# Patient Record
Sex: Female | Born: 1935 | Race: White | Hispanic: No | Smoking: Never smoker
Health system: Southern US, Community
[De-identification: ages and names within clinical notes are randomized; demographics above are authoritative.]

## PROBLEM LIST (undated history)

## (undated) DIAGNOSIS — N200 Calculus of kidney: Secondary | ICD-10-CM

## (undated) DIAGNOSIS — I5181 Takotsubo syndrome: Secondary | ICD-10-CM

## (undated) DIAGNOSIS — N289 Disorder of kidney and ureter, unspecified: Secondary | ICD-10-CM

## (undated) DIAGNOSIS — J329 Chronic sinusitis, unspecified: Secondary | ICD-10-CM

## (undated) DIAGNOSIS — M1990 Unspecified osteoarthritis, unspecified site: Secondary | ICD-10-CM

## (undated) DIAGNOSIS — K222 Esophageal obstruction: Secondary | ICD-10-CM

## (undated) DIAGNOSIS — I499 Cardiac arrhythmia, unspecified: Secondary | ICD-10-CM

## (undated) DIAGNOSIS — M797 Fibromyalgia: Secondary | ICD-10-CM

## (undated) DIAGNOSIS — I10 Essential (primary) hypertension: Secondary | ICD-10-CM

## (undated) HISTORY — DX: Cardiac arrhythmia, unspecified: I49.9

## (undated) HISTORY — PX: ABDOMINAL HYSTERECTOMY: SHX81

## (undated) HISTORY — DX: Takotsubo syndrome: I51.81

## (undated) HISTORY — PX: EYE SURGERY: SHX253

## (undated) HISTORY — DX: Unspecified osteoarthritis, unspecified site: M19.90

## (undated) HISTORY — DX: Essential (primary) hypertension: I10

## (undated) HISTORY — DX: Esophageal obstruction: K22.2

## (undated) HISTORY — PX: CHOLECYSTECTOMY: SHX55

## (undated) HISTORY — DX: Disorder of kidney and ureter, unspecified: N28.9

---

## 2006-04-29 ENCOUNTER — Emergency Department (HOSPITAL_COMMUNITY): Admission: EM | Admit: 2006-04-29 | Discharge: 2006-04-29 | Payer: Self-pay | Admitting: Emergency Medicine

## 2007-01-15 DIAGNOSIS — I5181 Takotsubo syndrome: Secondary | ICD-10-CM

## 2007-01-15 HISTORY — DX: Takotsubo syndrome: I51.81

## 2008-04-18 ENCOUNTER — Encounter: Payer: Self-pay | Admitting: Cardiology

## 2008-06-26 ENCOUNTER — Inpatient Hospital Stay (HOSPITAL_COMMUNITY): Admission: EM | Admit: 2008-06-26 | Discharge: 2008-06-29 | Payer: Self-pay | Admitting: Cardiology

## 2008-06-26 ENCOUNTER — Ambulatory Visit: Payer: Self-pay | Admitting: Cardiology

## 2008-06-27 ENCOUNTER — Encounter: Payer: Self-pay | Admitting: Cardiology

## 2008-07-01 ENCOUNTER — Encounter: Payer: Self-pay | Admitting: Cardiology

## 2008-07-06 ENCOUNTER — Encounter: Payer: Self-pay | Admitting: Internal Medicine

## 2008-07-06 ENCOUNTER — Encounter: Payer: Self-pay | Admitting: Cardiology

## 2008-07-13 ENCOUNTER — Ambulatory Visit: Payer: Self-pay | Admitting: Cardiology

## 2008-10-25 ENCOUNTER — Ambulatory Visit: Payer: Self-pay | Admitting: Cardiology

## 2008-10-25 DIAGNOSIS — R0602 Shortness of breath: Secondary | ICD-10-CM

## 2008-10-25 DIAGNOSIS — E7800 Pure hypercholesterolemia, unspecified: Secondary | ICD-10-CM

## 2008-10-25 DIAGNOSIS — R5383 Other fatigue: Secondary | ICD-10-CM

## 2008-10-25 DIAGNOSIS — M069 Rheumatoid arthritis, unspecified: Secondary | ICD-10-CM | POA: Insufficient documentation

## 2008-10-25 DIAGNOSIS — I428 Other cardiomyopathies: Secondary | ICD-10-CM

## 2008-10-25 DIAGNOSIS — K449 Diaphragmatic hernia without obstruction or gangrene: Secondary | ICD-10-CM | POA: Insufficient documentation

## 2008-10-25 DIAGNOSIS — I200 Unstable angina: Secondary | ICD-10-CM

## 2008-10-25 DIAGNOSIS — R05 Cough: Secondary | ICD-10-CM

## 2008-10-25 DIAGNOSIS — I10 Essential (primary) hypertension: Secondary | ICD-10-CM | POA: Insufficient documentation

## 2008-10-25 DIAGNOSIS — R5381 Other malaise: Secondary | ICD-10-CM

## 2008-12-02 ENCOUNTER — Encounter: Payer: Self-pay | Admitting: Cardiology

## 2008-12-04 ENCOUNTER — Encounter: Payer: Self-pay | Admitting: Cardiology

## 2008-12-05 ENCOUNTER — Encounter: Payer: Self-pay | Admitting: Cardiology

## 2009-04-18 ENCOUNTER — Ambulatory Visit: Payer: Self-pay | Admitting: Cardiology

## 2010-02-13 NOTE — Letter (Signed)
Summary: [facility] D/C DR. Beatrix Fetters [facility]  [facility] D/C DR. Kirstie Peri   Imported By: Zachary George 04/17/2009 16:28:22  _____________________________________________________________________  External Attachment:    Type:   Image     Comment:   External Document

## 2010-02-13 NOTE — Assessment & Plan Note (Signed)
Summary: 6 MONTH FU RECV LETTER VS   Visit Type:  Follow-up Primary Provider:  Dr. Kirstie Peri  CC:  Cardiomyopathy..  History of Present Illness: The patient returns for followup of her previously reduced ejection fraction with nonobstructive coronary disease (minimal). Since I last saw her she was in the hospital with pain which was felt to be atypical. She has had an outpatient echocardiogram and I reviewed this. Interestingly her ejection fraction was said to be normal though there was some evidence of diastolic dysfunction.  Today she is feeling well and reports no acute symptoms. She does her chores of daily living including vacuuming. She will get fatigued but she paces herself and does well. She denies chest discomfort, neck or arm discomfort. She has no significant shortness of breath and denies any PND or orthopnea. She does not notice any presyncope or syncope. She has trace ankle edema.  Preventive Screening-Counseling & Management  Alcohol-Tobacco     Smoking Status: never  Current Medications (verified): 1)  Lisinopril 5 Mg Tabs (Lisinopril) .... Take 1 Tablet By Mouth Once A Day 2)  Loratadine 10 Mg Tabs (Loratadine) .... Take 1 Tablet By Mouth Once A Day 3)  Prednisone 5 Mg Tabs (Prednisone) .... Take 1 Tablet By Mouth Once A Day 4)  Simvastatin 40 Mg Tabs (Simvastatin) .... Take 1 Tablet By Mouth Once A Day 5)  Metoprolol Succinate 25 Mg Xr24h-Tab (Metoprolol Succinate) .... Take 1 Tablet By Mouth Once A Day 6)  Eye-Vites  Tabs (Multiple Vitamins-Minerals) .... Take 1 Tablet By Mouth Once A Day 7)  Calcium 1200-1000 Mg-Unit Chew (Calcium Carbonate-Vit D-Min) .... Take 1 Tablet By Mouth Once A Day (Doesn't Take Everyday) 8)  Imodium A-D 2 Mg Tabs (Loperamide Hcl) .... As Needed  Allergies: 1)  ! Pcn  Comments:  Nurse/Medical Assistant: The patient's medications were reviewed with the patient and were updated in the Medication List. Pt brought medication bottles to  office visit.  Cyril Loosen, RN, BSN (April 18, 2009 10:42 AM)  Past History:  Past Medical History: Cardiomyopathy(35% nonischemic.  Most recent EF NL echo 11/10) , mild coronary plaque, hypertension, fibromyalgia, chronic steroid use (apparently secondary to fibromyalgia), nephrolithiasis, cholelithiasis, osteoporosis, degenerative joint disease  Past Surgical History: Reviewed history from 10/25/2008 and no changes required. Abdominal Hysterectomy-Total Cholecystectomy  Review of Systems       As stated in the HPI and negative for all other systems.   Vital Signs:  Patient profile:   77 year old female Height:      64 inches Weight:      118.50 pounds Pulse rate:   59 / minute BP sitting:   143 / 83  (left arm) Cuff size:   regular  Vitals Entered By: Cyril Loosen, RN, BSN (April 18, 2009 10:37 AM) CC: Cardiomyopathy. Comments Follow up visit   Physical Exam  General:  Well developed, well nourished, in no acute distress. Head:  normocephalic and atraumatic Neck:  Neck supple, no JVD. No masses, thyromegaly or abnormal cervical nodes. Chest Wall:  no deformities or breast masses noted Lungs:  Clear bilaterally to auscultation and percussion. Abdomen:  Bowel sounds positive; abdomen soft and non-tender without masses, organomegaly, or hernias noted. No hepatosplenomegaly. Msk:  Back normal, normal gait. Muscle strength and tone normal. Extremities:  No clubbing or cyanosis. Neurologic:  Alert and oriented x 3. Psych:  Normal affect.   Detailed Cardiovascular Exam  Neck    Carotids: Carotids full and equal  bilaterally without bruits.      Neck Veins: Normal, no JVD.    Heart    Inspection: no deformities or lifts noted.      Palpation: normal PMI with no thrills palpable.      Auscultation: regular rate and rhythm, S1, S2 without murmurs, rubs, gallops, or clicks.    Vascular    Abdominal Aorta: no palpable masses, pulsations, or audible bruits.       Femoral Pulses: right femoral bruit    Pedal Pulses: normal pedal pulses bilaterally.      Radial Pulses: normal radial pulses bilaterally.      Peripheral Circulation: no clubbing, cyanosis, or edema noted with normal capillary refill.     Impression & Recommendations:  Problem # 1:  CARDIOMYOPATHY (ICD-425.4) Her EF is now shown to be normal on echo. She has no symptoms. At this point she can continue with current medications without further testing.  Problem # 2:  SHORTNESS OF BREATH (ICD-786.05) She seems to be doing well with this. She tolerates some mild dyspnea with exertion. No further evaluation is warranted. Of note there was some suggestion of diastolic dysfunction on the recent echo. Therefore, salt and fluid restriction  as well as control of her blood pressure should be part of the therapy.  Problem # 3:  HYPERTENSION (ICD-401.9) Her blood pressure is very slightly elevated but she reports that it is otherwise well controlled.  Patient Instructions: 1)  Your physician recommends that you continue on your current medications as directed. Please refer to the Current Medication list given to you today.  2)  No follow up needed.

## 2010-02-16 ENCOUNTER — Telehealth (INDEPENDENT_AMBULATORY_CARE_PROVIDER_SITE_OTHER): Payer: Self-pay | Admitting: *Deleted

## 2010-02-19 DIAGNOSIS — I059 Rheumatic mitral valve disease, unspecified: Secondary | ICD-10-CM
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
## 2010-03-01 ENCOUNTER — Inpatient Hospital Stay (HOSPITAL_COMMUNITY)
Admission: EM | Admit: 2010-03-01 | Discharge: 2010-03-14 | DRG: 312 | Disposition: A | Discharge status: Skilled Nursing Facility | Payer: No Typology Code available for payment source | Source: Ambulatory Visit | Attending: Internal Medicine | Admitting: Internal Medicine

## 2010-03-01 ENCOUNTER — Emergency Department (HOSPITAL_COMMUNITY): Payer: No Typology Code available for payment source

## 2010-03-01 ENCOUNTER — Encounter (HOSPITAL_COMMUNITY): Payer: Self-pay | Admitting: Radiology

## 2010-03-01 DIAGNOSIS — N189 Chronic kidney disease, unspecified: Secondary | ICD-10-CM | POA: Diagnosis present

## 2010-03-01 DIAGNOSIS — E860 Dehydration: Secondary | ICD-10-CM | POA: Diagnosis present

## 2010-03-01 DIAGNOSIS — D72829 Elevated white blood cell count, unspecified: Secondary | ICD-10-CM | POA: Diagnosis present

## 2010-03-01 DIAGNOSIS — R55 Syncope and collapse: Principal | ICD-10-CM | POA: Diagnosis present

## 2010-03-01 DIAGNOSIS — M1990 Unspecified osteoarthritis, unspecified site: Secondary | ICD-10-CM | POA: Diagnosis present

## 2010-03-01 DIAGNOSIS — N179 Acute kidney failure, unspecified: Secondary | ICD-10-CM | POA: Diagnosis present

## 2010-03-01 DIAGNOSIS — IMO0002 Reserved for concepts with insufficient information to code with codable children: Secondary | ICD-10-CM

## 2010-03-01 DIAGNOSIS — IMO0001 Reserved for inherently not codable concepts without codable children: Secondary | ICD-10-CM | POA: Diagnosis present

## 2010-03-01 DIAGNOSIS — M353 Polymyalgia rheumatica: Secondary | ICD-10-CM | POA: Diagnosis present

## 2010-03-01 DIAGNOSIS — I252 Old myocardial infarction: Secondary | ICD-10-CM

## 2010-03-01 DIAGNOSIS — I129 Hypertensive chronic kidney disease with stage 1 through stage 4 chronic kidney disease, or unspecified chronic kidney disease: Secondary | ICD-10-CM | POA: Diagnosis present

## 2010-03-01 DIAGNOSIS — K559 Vascular disorder of intestine, unspecified: Secondary | ICD-10-CM | POA: Diagnosis present

## 2010-03-01 DIAGNOSIS — E785 Hyperlipidemia, unspecified: Secondary | ICD-10-CM | POA: Diagnosis present

## 2010-03-01 DIAGNOSIS — K449 Diaphragmatic hernia without obstruction or gangrene: Secondary | ICD-10-CM | POA: Diagnosis present

## 2010-03-01 HISTORY — DX: Chronic sinusitis, unspecified: J32.9

## 2010-03-01 HISTORY — DX: Fibromyalgia: M79.7

## 2010-03-01 HISTORY — DX: Unspecified osteoarthritis, unspecified site: M19.90

## 2010-03-01 HISTORY — DX: Calculus of kidney: N20.0

## 2010-03-01 LAB — TYPE AND SCREEN
ABO/RH(D): A POS
Antibody Screen: NEGATIVE

## 2010-03-01 LAB — URINALYSIS, ROUTINE W REFLEX MICROSCOPIC
Ketones, ur: 15 mg/dL — AB
Protein, ur: NEGATIVE mg/dL
Urobilinogen, UA: 1 mg/dL (ref 0.0–1.0)

## 2010-03-01 LAB — COMPREHENSIVE METABOLIC PANEL
Albumin: 2.7 g/dL — ABNORMAL LOW (ref 3.5–5.2)
Alkaline Phosphatase: 159 U/L — ABNORMAL HIGH (ref 39–117)
BUN: 20 mg/dL (ref 6–23)
Calcium: 8.9 mg/dL (ref 8.4–10.5)
Glucose, Bld: 159 mg/dL — ABNORMAL HIGH (ref 70–99)
Potassium: 3.6 mEq/L (ref 3.5–5.1)
Sodium: 135 mEq/L (ref 135–145)
Total Protein: 6.1 g/dL (ref 6.0–8.3)

## 2010-03-01 LAB — CK TOTAL AND CKMB (NOT AT ARMC)
CK, MB: 1 ng/mL (ref 0.3–4.0)
Relative Index: INVALID (ref 0.0–2.5)
Total CK: 29 U/L (ref 7–177)

## 2010-03-01 LAB — DIFFERENTIAL
Basophils Absolute: 0 10*3/uL (ref 0.0–0.1)
Lymphocytes Relative: 2 % — ABNORMAL LOW (ref 12–46)
Lymphs Abs: 0.4 10*3/uL — ABNORMAL LOW (ref 0.7–4.0)
Neutro Abs: 20.8 10*3/uL — ABNORMAL HIGH (ref 1.7–7.7)
Neutrophils Relative %: 93 % — ABNORMAL HIGH (ref 43–77)

## 2010-03-01 LAB — POCT CARDIAC MARKERS
CKMB, poc: <1 ng/mL — ABNORMAL LOW (ref 1.0–8.0)
Myoglobin, poc: 197 ng/mL (ref 12–200)
Troponin i, poc: <0.05 ng/mL (ref 0.00–0.09)

## 2010-03-01 LAB — ABO/RH: ABO/RH(D): A POS

## 2010-03-01 LAB — CBC
HCT: 33.7 % — ABNORMAL LOW (ref 36.0–46.0)
Hemoglobin: 10.8 g/dL — ABNORMAL LOW (ref 12.0–15.0)
MCH: 29.3 pg (ref 26.0–34.0)
MCHC: 32 g/dL (ref 30.0–36.0)
MCV: 91.3 fL (ref 78.0–100.0)

## 2010-03-01 LAB — LIPASE, BLOOD: Lipase: 22 U/L (ref 11–59)

## 2010-03-01 LAB — POCT I-STAT, CHEM 8
BUN: 21 mg/dL (ref 6–23)
Calcium, Ion: 1.11 mmol/L — ABNORMAL LOW (ref 1.12–1.32)
Chloride: 98 mEq/L (ref 96–112)
Glucose, Bld: 153 mg/dL — ABNORMAL HIGH (ref 70–99)
Potassium: 3.6 mEq/L (ref 3.5–5.1)

## 2010-03-01 LAB — URINE MICROSCOPIC-ADD ON

## 2010-03-01 LAB — LACTIC ACID, PLASMA: Lactic Acid, Venous: 2.7 mmol/L — ABNORMAL HIGH (ref 0.5–2.2)

## 2010-03-01 LAB — TROPONIN I: Troponin I: 0.02 ng/mL (ref 0.00–0.06)

## 2010-03-01 LAB — GLUCOSE, CAPILLARY

## 2010-03-02 DIAGNOSIS — R55 Syncope and collapse: Secondary | ICD-10-CM

## 2010-03-02 LAB — CLOSTRIDIUM DIFFICILE BY PCR: Toxigenic C. Difficile by PCR: NEGATIVE

## 2010-03-02 LAB — COMPREHENSIVE METABOLIC PANEL
Albumin: 2.1 g/dL — ABNORMAL LOW (ref 3.5–5.2)
Alkaline Phosphatase: 112 U/L (ref 39–117)
BUN: 16 mg/dL (ref 6–23)
CO2: 29 mEq/L (ref 19–32)
Chloride: 100 mEq/L (ref 96–112)
Creatinine, Ser: 1.36 mg/dL — ABNORMAL HIGH (ref 0.4–1.2)
GFR calc non Af Amer: 37 mL/min — ABNORMAL LOW (ref >60)
Potassium: 3.7 mEq/L (ref 3.5–5.1)
Total Bilirubin: 0.5 mg/dL (ref 0.3–1.2)

## 2010-03-02 LAB — CBC
HCT: 30.2 % — ABNORMAL LOW (ref 36.0–46.0)
Hemoglobin: 9.7 g/dL — ABNORMAL LOW (ref 12.0–15.0)
MCH: 28.8 pg (ref 26.0–34.0)
MCV: 89.6 fL (ref 78.0–100.0)
Platelets: 330 10*3/uL (ref 150–400)
RBC: 3.37 MIL/uL — ABNORMAL LOW (ref 3.87–5.11)
WBC: 24.1 10*3/uL — ABNORMAL HIGH (ref 4.0–10.5)

## 2010-03-02 LAB — DIFFERENTIAL
Basophils Relative: 0 % (ref 0–1)
Eosinophils Relative: 0 % (ref 0–5)
Lymphocytes Relative: 3 % — ABNORMAL LOW (ref 12–46)
Monocytes Relative: 4 % (ref 3–12)
Neutro Abs: 22.4 10*3/uL — ABNORMAL HIGH (ref 1.7–7.7)
Neutrophils Relative %: 93 % — ABNORMAL HIGH (ref 43–77)

## 2010-03-02 LAB — CARDIAC PANEL(CRET KIN+CKTOT+MB+TROPI)
Relative Index: INVALID (ref 0.0–2.5)
Relative Index: INVALID (ref 0.0–2.5)
Total CK: 19 U/L (ref 7–177)
Total CK: 20 U/L (ref 7–177)
Troponin I: <0.01 ng/mL (ref 0.00–0.06)
Troponin I: 0.01 ng/mL (ref 0.00–0.06)

## 2010-03-02 LAB — CK TOTAL AND CKMB (NOT AT ARMC): CK, MB: 0.8 ng/mL (ref 0.3–4.0)
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
## 2010-03-03 LAB — CBC
HCT: 28.4 % — ABNORMAL LOW (ref 36.0–46.0)
Hemoglobin: 9.1 g/dL — ABNORMAL LOW (ref 12.0–15.0)
MCHC: 32 g/dL (ref 30.0–36.0)
RBC: 3.1 MIL/uL — ABNORMAL LOW (ref 3.87–5.11)
WBC: 23.6 10*3/uL — ABNORMAL HIGH (ref 4.0–10.5)

## 2010-03-03 LAB — BASIC METABOLIC PANEL
CO2: 30 mEq/L (ref 19–32)
Chloride: 102 mEq/L (ref 96–112)
Glucose, Bld: 103 mg/dL — ABNORMAL HIGH (ref 70–99)
Potassium: 3.7 mEq/L (ref 3.5–5.1)
Sodium: 137 mEq/L (ref 135–145)

## 2010-03-04 DIAGNOSIS — R55 Syncope and collapse: Secondary | ICD-10-CM

## 2010-03-04 LAB — CBC
HCT: 28.8 % — ABNORMAL LOW (ref 36.0–46.0)
Hemoglobin: 9.1 g/dL — ABNORMAL LOW (ref 12.0–15.0)
MCH: 28.7 pg (ref 26.0–34.0)
MCHC: 31.6 g/dL (ref 30.0–36.0)
MCV: 90.9 fL (ref 78.0–100.0)
RBC: 3.17 MIL/uL — ABNORMAL LOW (ref 3.87–5.11)

## 2010-03-04 LAB — BASIC METABOLIC PANEL
CO2: 30 mEq/L (ref 19–32)
Calcium: 8.4 mg/dL (ref 8.4–10.5)
Chloride: 106 mEq/L (ref 96–112)
Creatinine, Ser: 1 mg/dL (ref 0.4–1.2)
Glucose, Bld: 105 mg/dL — ABNORMAL HIGH (ref 70–99)

## 2010-03-04 LAB — URINE CULTURE: Culture: NO GROWTH

## 2010-03-05 ENCOUNTER — Inpatient Hospital Stay (HOSPITAL_COMMUNITY): Payer: No Typology Code available for payment source

## 2010-03-05 LAB — CBC
HCT: 29.1 % — ABNORMAL LOW (ref 36.0–46.0)
Hemoglobin: 9.3 g/dL — ABNORMAL LOW (ref 12.0–15.0)
MCHC: 32 g/dL (ref 30.0–36.0)
RDW: 13.8 % (ref 11.5–15.5)
WBC: 12 10*3/uL — ABNORMAL HIGH (ref 4.0–10.5)

## 2010-03-05 LAB — BASIC METABOLIC PANEL
BUN: 6 mg/dL (ref 6–23)
BUN: 6 mg/dL (ref 6–23)
CO2: 30 mEq/L (ref 19–32)
Calcium: 8.3 mg/dL — ABNORMAL LOW (ref 8.4–10.5)
Chloride: 104 mEq/L (ref 96–112)
Chloride: 101 mEq/L (ref 96–112)
Creatinine, Ser: 0.9 mg/dL (ref 0.4–1.2)
GFR calc Af Amer: >60 mL/min (ref >60)
GFR calc non Af Amer: 59 mL/min — ABNORMAL LOW (ref >60)
GFR calc non Af Amer: 57 mL/min — ABNORMAL LOW (ref >60)
Glucose, Bld: 110 mg/dL — ABNORMAL HIGH (ref 70–99)
Potassium: 3.5 mEq/L (ref 3.5–5.1)
Potassium: 3.5 mEq/L (ref 3.5–5.1)
Sodium: 139 mEq/L (ref 135–145)
Sodium: 137 mEq/L (ref 135–145)

## 2010-03-06 LAB — CBC
HCT: 29.2 % — ABNORMAL LOW (ref 36.0–46.0)
Hemoglobin: 9.4 g/dL — ABNORMAL LOW (ref 12.0–15.0)
MCV: 90.4 fL (ref 78.0–100.0)
RBC: 3.23 MIL/uL — ABNORMAL LOW (ref 3.87–5.11)
WBC: 10.8 10*3/uL — ABNORMAL HIGH (ref 4.0–10.5)

## 2010-03-06 LAB — BASIC METABOLIC PANEL
BUN: 6 mg/dL (ref 6–23)
CO2: 30 mEq/L (ref 19–32)
Chloride: 105 mEq/L (ref 96–112)
GFR calc non Af Amer: 58 mL/min — ABNORMAL LOW (ref >60)
Glucose, Bld: 116 mg/dL — ABNORMAL HIGH (ref 70–99)
Potassium: 3.5 mEq/L (ref 3.5–5.1)
Sodium: 139 mEq/L (ref 135–145)

## 2010-03-07 LAB — CBC
HCT: 28.7 % — ABNORMAL LOW (ref 36.0–46.0)
Hemoglobin: 9.1 g/dL — ABNORMAL LOW (ref 12.0–15.0)
MCH: 28.3 pg (ref 26.0–34.0)
MCHC: 31.7 g/dL (ref 30.0–36.0)
MCV: 89.4 fL (ref 78.0–100.0)
RDW: 13.9 % (ref 11.5–15.5)

## 2010-03-07 LAB — BASIC METABOLIC PANEL
BUN: 6 mg/dL (ref 6–23)
CO2: 31 mEq/L (ref 19–32)
Calcium: 8.2 mg/dL — ABNORMAL LOW (ref 8.4–10.5)
Glucose, Bld: 112 mg/dL — ABNORMAL HIGH (ref 70–99)
Sodium: 140 mEq/L (ref 135–145)

## 2010-03-07 LAB — CULTURE, BLOOD (ROUTINE X 2)
Culture  Setup Time: 201202162336
Culture: NO GROWTH

## 2010-03-08 LAB — CBC
Hemoglobin: 9.5 g/dL — ABNORMAL LOW (ref 12.0–15.0)
MCH: 29.1 pg (ref 26.0–34.0)
MCHC: 32.8 g/dL (ref 30.0–36.0)
RDW: 14.1 % (ref 11.5–15.5)
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
## 2010-03-11 ENCOUNTER — Inpatient Hospital Stay (HOSPITAL_COMMUNITY): Payer: No Typology Code available for payment source

## 2010-03-11 LAB — CBC
MCH: 28.9 pg (ref 26.0–34.0)
MCV: 88.5 fL (ref 78.0–100.0)
Platelets: 370 10*3/uL (ref 150–400)
RDW: 14.4 % (ref 11.5–15.5)
WBC: 9.9 10*3/uL (ref 4.0–10.5)

## 2010-03-11 LAB — BASIC METABOLIC PANEL
BUN: 12 mg/dL (ref 6–23)
Creatinine, Ser: 0.92 mg/dL (ref 0.4–1.2)
GFR calc Af Amer: >60 mL/min (ref >60)
GFR calc non Af Amer: 57 mL/min — ABNORMAL LOW (ref >60)
Potassium: 3.1 mEq/L — ABNORMAL LOW (ref 3.5–5.1)

## 2010-03-12 ENCOUNTER — Inpatient Hospital Stay (HOSPITAL_COMMUNITY): Payer: No Typology Code available for payment source

## 2010-03-12 LAB — COMPREHENSIVE METABOLIC PANEL
Albumin: 2.3 g/dL — ABNORMAL LOW (ref 3.5–5.2)
BUN: 10 mg/dL (ref 6–23)
Creatinine, Ser: 1.05 mg/dL (ref 0.4–1.2)
Total Protein: 5.7 g/dL — ABNORMAL LOW (ref 6.0–8.3)

## 2010-03-12 LAB — CLOSTRIDIUM DIFFICILE BY PCR: Toxigenic C. Difficile by PCR: NEGATIVE
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
## 2010-03-13 ENCOUNTER — Encounter (INDEPENDENT_AMBULATORY_CARE_PROVIDER_SITE_OTHER): Payer: Self-pay | Admitting: *Deleted

## 2010-03-13 LAB — BASIC METABOLIC PANEL
CO2: 26 mEq/L (ref 19–32)
Chloride: 108 mEq/L (ref 96–112)
GFR calc non Af Amer: 57 mL/min — ABNORMAL LOW (ref >60)
Glucose, Bld: 90 mg/dL (ref 70–99)
Potassium: 3.8 mEq/L (ref 3.5–5.1)
Sodium: 138 mEq/L (ref 135–145)

## 2010-03-14 LAB — BASIC METABOLIC PANEL
Calcium: 8.4 mg/dL (ref 8.4–10.5)
GFR calc Af Amer: >60 mL/min (ref >60)
GFR calc non Af Amer: 51 mL/min — ABNORMAL LOW (ref >60)
Glucose, Bld: 100 mg/dL — ABNORMAL HIGH (ref 70–99)
Potassium: 4.7 mEq/L (ref 3.5–5.1)
Sodium: 142 mEq/L (ref 135–145)

## 2010-03-14 LAB — MAGNESIUM: Magnesium: 1.9 mg/dL (ref 1.5–2.5)

## 2010-03-15 NOTE — Discharge Summary (Signed)
[name], [name]              ACCOUNT NO.:  [id number]  MEDICAL RECORD NO.:  [id number]           PATIENT TYPE:  I  LOCATION:  5118                         FACILITY:  [facility]  PHYSICIAN:  Jeoffrey Massed, MD    DATE OF BIRTH:  [birth year]  DATE OF ADMISSION:  03/01/2010 DATE OF DISCHARGE:                        DISCHARGE SUMMARY - REFERRING   PRIMARY CARE PRACTITIONER:  Kirstie Peri, MD in [place].  PRIMARY CARDIOLOGIST:  Rollene Rotunda, MD, [facility]  PRIMARY DISCHARGE DIAGNOSES: 1. Syncope, presumed to be vasovagal. 2. Colitis, probably ischemic versus inflammatory. 3. Leukocytosis secondary to colitis, now resolved.  SECONDARY DISCHARGE DIAGNOSES: 1. Fibromyalgia, chronic prednisone therapy. 2. Hypertension. 3. Dyslipidemia. 4. History of nonischemic cardiomyopathy with an EF of around 30% to     35%. 5. History of hiatal hernia. 6. History of cholelithiasis. 7. Degenerative joint disease. 8. History of nephrolithiasis.  DISCHARGE MEDICATIONS: 1. Ciprofloxacin 500 mg 1 tablet p.o. q.24 h. 2. Hydrocodone/APAP 5/25 1 tablet p.o. q. 6 h. p.r.n. 3. Toprol-XL 25 mg 1 tablet p.o. daily. 4. Lisinopril 10 mg 1 tablet p.o. daily. 5. Flagyl 500 mg 1 tablet p.o. q.8 h. for 7 more days. 6. Zofran 4 mg p.o. q.6 h. p.r.n. 7. Protonix 40 mg 1 tablet p.o. daily.  CONSULTATIONS:  [facility].  HISTORY OF PRESENT ILLNESS:  The patient is a very pleasant 77 year old female who has the above-noted medical problems was brought in on March 01, 2010 with syncope and abdominal pain with diarrhea.  She was then evaluated in the ED and then admitted to the hospitalist service for further evaluation and treatment.  For further details, please see the history and physical that was dictated by Dr. Beverly Gust on admission.  PERTINENT RADIOLOGICAL STUDIES:  CT of the chest done March 01, 2010 showed bilateral pleural effusion and bibasilar atelectasis, right greater than left.   Large hiatal hernia.  Trace pericardial fluid.  CT of the abdomen and pelvis showed long segment of bowel thickening extending from splenic flexure through the rectum.  Mild fullness of the pancreatic head with peripancreatic stranding.  Large hiatal hernia. Posterior rib fractures on the right.  PERTINENT LABORATORY DATA: 1. Last CBC done yesterday shows a WBC of 10.1, hemoglobin of 9.5 and     platelet count of 425. 2. Blood cultures done on March 01, 2010 is negative. 3. CBC on admission showed a WBC of 22.4. 4. Lipase was 22.  BRIEF HOSPITAL COURSE: 1. Colitis, probably this is all ischemic.  In any event, the patient     had leukocytosis and was started on Flagyl and ciprofloxacin.  She     was briefly kept n.p.o. and diet was slowly advanced.  Over the     course of hospitalization, her leukocytosis has almost come down to     normal.  She is able to tolerate a low residual diet without any     incidence.  She is nonambulatory in the room, has had one or two     bowel movements today that are much more well formed.  The current     plans are to transition her to  oral antibiotics and to continue     them for another seven more days.  She will be discharged to a     skilled nursing facility when a bed is available.  Given the fact     that this could be a possible ischemic colitis, it is suggested     that we may monitor her blood pressure very well.  We have     restarted on metoprolol.  However, her dose of lisinopril has been     decreased to 10 mg.  We have not yet started on Lasix.  If she     continues to do further improvement, her Lasix will need to be     started at some point in time. 2. Syncope.  This is all likely secondary to vasovagal episode as the     patient had extreme pain prior to her passing out.  She does have a     history of nonischemic cardiomyopathy with an EF around 35%, but at     this point in time it is failed that this is more related to      vasovagal than anything else.  Please note that a 2-D     echocardiogram done March 04, 2010 showed an EF around 60% to     65%. 3. Leukocytosis, this is secondary to colitis.  This is now resolved. 4. History of polymyalgia rheumatica.  The patient has maintained on     chronic prednisone. 5. History of hypertension.  The patient was started on metoprolol.     She will also be started on lisinopril at a lower dose prior to     discharge.  DISPOSITION:  The patient will be discharged to the skilled nursing facility when a bed is available.  FOLLOWUP INSTRUCTIONS: 1. The patient is to follow up with the primary care practitioner, Dr.     Kirstie Peri within 1 to 2 weeks upon discharge from the skilled     nursing facility. 2. She is to follow up with her primary cardiologist, Dr. Antoine Poche     within 1 to 2 weeks upon discharge, is to call and make an     appointment.  Total time spent 45 minutes.     Jeoffrey Massed, MD     SG/MEDQ  D:  03/09/2010  T:  03/09/2010  Job:  [id number]  cc:   Kirstie Peri, MD Rollene Rotunda, MD, [facility]  Electronically Signed by Jeoffrey Massed  on 03/15/2010 03:47:42 PM

## 2010-03-22 NOTE — Letter (Signed)
Summary: New Patient letter  [facility]  [address] [place], [place] [address]   Phone: [phone]  Fax: [phone]       03/13/2010 MRN: [id number]  [name] [address] [place], [place]  [address]  [place]  Dear Ms. Gauna,  Welcome to the Gastroenterology Division at [facility].    You are scheduled to see Dr.  Yancey Flemings on April 16, 2010 at 9:15am on the 3rd floor at [facility], [address].  We ask that you try to arrive at our office 15 minutes prior to your appointment time to allow for check-in.  We would like you to complete the enclosed self-administered evaluation form prior to your visit and bring it with you on the day of your appointment.  We will review it with you.  Also, please bring a complete list of all your medications or, if you prefer, bring the medication bottles and we will list them.  Please bring your insurance card so that we may make a copy of it.  If your insurance requires a referral to see a specialist, please bring your referral form from your primary care physician.  Co-payments are due at the time of your visit and may be paid by cash, check or credit card.     Your office visit will consist of a consult with your physician (includes a physical exam), any laboratory testing he/she may order, scheduling of any necessary diagnostic testing (e.g. x-ray, ultrasound, CT-scan), and scheduling of a procedure (e.g. Endoscopy, Colonoscopy) if required.  Please allow enough time on your schedule to allow for any/all of these possibilities.    If you cannot keep your appointment, please call [phone] to cancel or reschedule prior to your appointment date.  This allows [place] the opportunity to schedule an appointment for another patient in need of care.  If you do not cancel or reschedule by 5 p.m. the business day prior to your appointment date, you will be charged a $50.00 late cancellation/no-show fee.    Thank you for choosing  [facility] for your medical needs.  We appreciate the opportunity to care for you.  Please visit [place] at our website  to learn more about our practice.                     Sincerely,                                                             The Gastroenterology Division

## 2010-03-24 NOTE — Discharge Summary (Signed)
  NAMEWILLEEN, [name]              ACCOUNT NO.:  [id number]  MEDICAL RECORD NO.:  [id number]           PATIENT TYPE:  I  LOCATION:  5118                         FACILITY:  [facility]  PHYSICIAN:  Richarda Overlie, MD       DATE OF BIRTH:  [birth year]  DATE OF ADMISSION:  03/01/2010 DATE OF DISCHARGE:  03/11/2010                        DISCHARGE SUMMARY - REFERRING   ADDENDUM: Kindly see discharge summary done by Dr. Jerral Ralph on March 09, 2010, for further details of the patient's discharge diagnoses and acute hospitalization.  DISCHARGE MEDICATIONS: 1. Ciprofloxacin 500 mg every 24 for 5 more days. 2. Norco 5/325 one tablet p.o. every 6 p.r.n. as needed for another 5     days. 3. Maalox/Mylanta 30 mL p.o. every 6 p.r.n. 4. Metoprolol 25 mg p.o. daily. 5. Metronidazole 500 mg p.o. every 8 hours x5 days. 6. Zofran 4 mg p.o. every 6 p.r.n. 7. Protonix 40 mg p.o. daily. 8. Lisinopril 10 mg p.o. daily. 9. Lasix 20 mg p.o. daily.     Richarda Overlie, MD     NA/MEDQ  D:  03/11/2010  T:  03/11/2010  Job:  [id number]  Electronically Signed by Richarda Overlie MD on 03/24/2010 08:16:45 AM

## 2010-03-29 NOTE — Discharge Summary (Signed)
  [name], [name]              ACCOUNT NO.:  [id number]  MEDICAL RECORD NO.:  [id number]           PATIENT TYPE:  I  LOCATION:  5156                         FACILITY:  [facility]  PHYSICIAN:  Peggye Pitt, M.D. DATE OF BIRTH:  25-Sep-1918  DATE OF ADMISSION:  03/01/2010 DATE OF DISCHARGE:  03/14/2010                        DISCHARGE SUMMARY - REFERRING   ADDENDUM: Please refer to discharge summary done by Dr. Jerral Ralph on March 09, 2010, for details of the patient's discharge diagnoses and acute hospitalization.  The patient was kept an extra 5 days due to lack of availability of a skilled nursing facility bed.  Bed has become available today and we feel she is stable for discharge.  FINAL DISCHARGE MEDICATIONS:  Include, 1. Cipro 500 mg daily for one more day. 2. Vicodin 5/325 mg 1 tablet every 6 hours as needed for pain. 3. Maalox 30 mL every 6 hours as needed for dyspepsia. 4. Metoprolol 25 mg daily. 5. Flagyl 500 mg every 8 hours for one more day. 6. Zofran 4 mg every 6 hours as needed for nausea. 7. Oxycodone 5 mg 1-2 tablets every 4 hours as needed for pain. 8. Protonix 40 mg daily. 9. Lisinopril 10 mg daily. 10.Lasix 20 mg daily.     Peggye Pitt, M.D.     EH/MEDQ  D:  03/14/2010  T:  03/14/2010  Job:  [id number]  Electronically Signed by Peggye Pitt M.D. on 03/29/2010 05:25:25 PM

## 2010-04-16 ENCOUNTER — Ambulatory Visit: Payer: No Typology Code available for payment source | Admitting: Internal Medicine

## 2010-04-16 ENCOUNTER — Encounter: Payer: Self-pay | Admitting: Internal Medicine

## 2010-04-16 ENCOUNTER — Ambulatory Visit (INDEPENDENT_AMBULATORY_CARE_PROVIDER_SITE_OTHER): Payer: Medicare Other | Admitting: Internal Medicine

## 2010-04-16 VITALS — BP 148/74 | HR 64 | Ht 63.5 in | Wt 106.2 lb

## 2010-04-16 DIAGNOSIS — K559 Vascular disorder of intestine, unspecified: Secondary | ICD-10-CM

## 2010-04-16 DIAGNOSIS — R1032 Left lower quadrant pain: Secondary | ICD-10-CM

## 2010-04-16 DIAGNOSIS — R933 Abnormal findings on diagnostic imaging of other parts of digestive tract: Secondary | ICD-10-CM

## 2010-04-23 LAB — CARDIAC PANEL(CRET KIN+CKTOT+MB+TROPI)
CK, MB: 16.7 ng/mL — ABNORMAL HIGH (ref 0.3–4.0)
CK, MB: 3.1 ng/mL (ref 0.3–4.0)
Relative Index: 12 — ABNORMAL HIGH (ref 0.0–2.5)
Relative Index: 8.7 — ABNORMAL HIGH (ref 0.0–2.5)
Total CK: 139 U/L (ref 7–177)
Total CK: 188 U/L — ABNORMAL HIGH (ref 7–177)
Troponin I: 1.77 ng/mL (ref 0.00–0.06)
Troponin I: 2.07 ng/mL (ref 0.00–0.06)
Troponin I: 3.06 ng/mL (ref 0.00–0.06)

## 2010-04-23 LAB — COMPREHENSIVE METABOLIC PANEL
ALT: 20 U/L (ref 0–35)
AST: 37 U/L (ref 0–37)
Alkaline Phosphatase: 58 U/L (ref 39–117)
CO2: 24 mEq/L (ref 19–32)
Calcium: 8.7 mg/dL (ref 8.4–10.5)
Chloride: 107 mEq/L (ref 96–112)
GFR calc Af Amer: 59 mL/min — ABNORMAL LOW (ref >60)
GFR calc non Af Amer: 49 mL/min — ABNORMAL LOW (ref >60)
Glucose, Bld: 104 mg/dL — ABNORMAL HIGH (ref 70–99)
Potassium: 4.8 mEq/L (ref 3.5–5.1)
Sodium: 137 mEq/L (ref 135–145)

## 2010-04-23 LAB — CBC
Hemoglobin: 12.9 g/dL (ref 12.0–15.0)
Hemoglobin: 13.5 g/dL (ref 12.0–15.0)
MCHC: 33.6 g/dL (ref 30.0–36.0)
MCHC: 33.7 g/dL (ref 30.0–36.0)
MCHC: 33.7 g/dL (ref 30.0–36.0)
MCV: 92.5 fL (ref 78.0–100.0)
MCV: 92.6 fL (ref 78.0–100.0)
Platelets: 159 10*3/uL (ref 150–400)
RBC: 4.14 MIL/uL (ref 3.87–5.11)
RBC: 4.35 MIL/uL (ref 3.87–5.11)
RDW: 14.3 % (ref 11.5–15.5)
WBC: 7.8 10*3/uL (ref 4.0–10.5)
WBC: 8.2 10*3/uL (ref 4.0–10.5)

## 2010-04-23 LAB — BASIC METABOLIC PANEL
BUN: 20 mg/dL (ref 6–23)
CO2: 24 mEq/L (ref 19–32)
Calcium: 8.7 mg/dL (ref 8.4–10.5)
Creatinine, Ser: 1.08 mg/dL (ref 0.4–1.2)
Glucose, Bld: 118 mg/dL — ABNORMAL HIGH (ref 70–99)

## 2010-04-23 LAB — HEPARIN LEVEL (UNFRACTIONATED): Heparin Unfractionated: 1.31 IU/mL — ABNORMAL HIGH (ref 0.30–0.70)

## 2010-04-23 LAB — LIPID PANEL
HDL: 61 mg/dL (ref >39)
Total CHOL/HDL Ratio: 3.4 RATIO
Triglycerides: 46 mg/dL (ref <150)

## 2010-04-23 LAB — TSH: TSH: 1.362 u[IU]/mL (ref 0.350–4.500)

## 2010-04-24 ENCOUNTER — Encounter: Payer: Self-pay | Admitting: Internal Medicine
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
## 2010-05-29 NOTE — Cardiovascular Report (Signed)
[name], [name] NO.:  [id number]   MEDICAL RECORD NO.:  [id number]          PATIENT TYPE:  INP   LOCATION:  2909                         FACILITY:  [facility]   PHYSICIAN:  Bevelyn Buckles. Bensimhon, MDDATE OF BIRTH:  10/21/1918   DATE OF PROCEDURE:  06/27/2008  DATE OF DISCHARGE:                            CARDIAC CATHETERIZATION   IDENTIFICATION:  Ms. [name] is a delightful 77 year old woman who lives  independently.  She was admitted yesterday with chest pain.  Initial EKG  showed mild ST elevation inferiorly, but she was pain free.  She was  treated medically.  Cardiac enzymes did rise slightly with troponin of  3.  She subsequently developed an inferior and anterolateral T-wave  inversions.  She is brought today for coronary angiography.   PROCEDURES PERFORMED:  1. Selective cholangiography.  2. Left heart catheterization.  3. Left ventriculogram.  4. Aortic root angiogram.  5. StarClose femoral artery closure.   DESCRIPTION OF PROCEDURE:  The risks and indication were explained.  Consent was signed and placed in the chart.  Given her history of  contrast allergy she was premedicated with IV Benadryl and 60 mg of IV  Solu-Medrol.  A 5-French sheath was then was placed in the right femoral  artery using a modified Seldinger technique.  Standard catheters  including JL-4, JR-4, and angled pigtail used for catheterization.  All  catheter exchanges made over wire.  There were no apparent  complications.   Central aortic pressure 104,49 with a mean of 76.  LV pressure 109/0  with an EDP of 10.  There is no aortic stenosis.   Left main was normal.   LAD was moderate-sized vessel, which bifurcated distally.  There was a  30% tubular lesion throughout the midsection.  In more lateral distal  branch, there was sorted as a diagonal, there was 60% ostial stenosis.   Left circumflex gave off a large branching OM1, moderate-sized OM-2,  with angiographically  normal.   Right coronary artery is a large dominant vessel gave off PDA and  several posterolaterals as well as an RV branch is angiographically  normal.   Aortic root angiogram was normal.   There is no significant aortic insufficiency.   Left ventriculogram done in the RAO position showed an EF of 30-35%.  There was apical dyskinesis was consistent with apical ballooning  syndrome.  She had significant mitral regurgitation on the PVC beats,  but no significant regurgitation on other beats.   ASSESSMENT:  1. Nonobstructive coronary artery disease.  2. Tako takotsubo cardiomyopathy with apical ballooning syndrome, EF      of 30-35%.   PLAN:  As discussion, we will treat her medically with ACE inhibitor and  beta blocker.  We will check an echocardiogram.  I suspect her EFO would  cover Foley in 4-6 weeks.  We will treat her with heparin for 48 hours.      Bevelyn Buckles. Bensimhon, MD  Electronically Signed     DRB/MEDQ  D:  06/27/2008  T:  06/28/2008  Job:  [id number]

## 2010-05-29 NOTE — Discharge Summary (Signed)
NAMEJANYIA, [name]              ACCOUNT NO.:  [id number]   MEDICAL RECORD NO.:  [id number]          PATIENT TYPE:  INP   LOCATION:  6527                         FACILITY:  [facility]   PHYSICIAN:  Rollene Rotunda, MD, FACCDATE OF BIRTH:  Dec 21, 1918   DATE OF ADMISSION:  06/26/2008  DATE OF DISCHARGE:  06/29/2008                               DISCHARGE SUMMARY   PROCEDURES:  1. Cardiac catheterization.  2. Coronary arteriogram.  3. Left ventriculogram.  4. 2-D echocardiogram.   PRIMARY FINAL DISCHARGE DIAGNOSIS:  Non-ST-segment elevation myocardial  infarction secondary to toxic cardiomyopathy.   SECONDARY DIAGNOSES:  1. Nonischemic cardiomyopathy with an ejection fraction of 50% by echo      this admission and 30-35% at catheterization.  2. Hyperlipidemia with a total cholesterol of 210, triglycerides 46,      HDL 61, and LDL 140.  3. Hypertension.  4. Fibromyalgia.  5. History of nephrolithiasis.  6. Degenerative joint disease.  7. Cholelithiasis.  8. Osteoporosis.  9. Status post hysterectomy and cholecystectomy.  10.Allergy or intolerance to IBUPROFEN, CONTRAST DYE, NSAIDS, and      PENICILLIN.  11.Chronic steroid use, probably related to fibromyalgia or possible      history of polymyalgia rheumatica.  12.Family history of coronary artery disease in her sister, not      premature.   TIME AT DISCHARGE:  39 minutes.   HOSPITAL COURSE:  [name] is an 77 year old female with no previous  history of coronary artery disease.  She had chest pain and went to  [facility] where she had some EKG changes.  Her cardiac enzymes  were elevated and she was transferred to [facility] for further  evaluation and treatment.   She was managed with aspirin, anticoagulation, and nitrates.  Her chest  pain resolved.  She was cathed on June 27, 2008, and she had a 30% LAD  and a 60% diagonal.  Her EF was 30-35% with dyskinesis and ballooning at  the apex.  Dr. Gala Romney  evaluated the films and felt that she had  nonobstructive coronary artery disease and takotsubo cardiomyopathy.  Her ACE inhibitor was continued and she was started on a beta-blocker.  An echocardiogram was checked.   She was referred to Cardiac Rehab.  The echocardiogram showed an EF of  50% with PAS of 29 and a CVP of 5.  Her blood pressure was within normal  limits with a beta-blocker added to her medication regimen and because  of the hyperlipidemia, Zocor 40 was added.  On June 29, 2008, [name]  was ambulating without chest pain or shortness of breath.  She was  evaluated by Dr. Antoine Poche who considered her stable for discharge with  outpatient followup in [place].   DISCHARGE INSTRUCTIONS:  1. Her activity level is supposed to be increased gradually with no      lifting for 2 weeks and no driving for 3 days.  2. She is to stick to a low-sodium, heart-healthy diet.  3. She is to call our office for problems with the cath site.  4. She is to follow up  with Dr. Sherryll Burger as needed.  5. She is to follow up with Dr. Antoine Poche on June 26, 2008, at 2 p.m.   DISCHARGE MEDICATIONS:  1. Lisinopril 5 mg daily.  2. Aspirin 325 mg daily.  3. Zocor 40 mg daily.  4. Prednisone 5 mg daily  5. Toprol-XL 25 mg daily.  6. Eye vitamins and ginkgo biloba as well as glucosamine as prior to      admission.  7. Darvocet p.r.n.  8. Nitroglycerin 0.4 mg as needed.      Theodore Demark, PA-C      Rollene Rotunda, MD, [facility]  Electronically Signed    RB/MEDQ  D:  06/29/2008  T:  06/30/2008  Job:  [id number]   cc:   Kirstie Peri, MD  [facility]

## 2010-05-29 NOTE — H&P (Signed)
[name], Brandt NO.:  [id number]   MEDICAL RECORD NO.:  [id number]          PATIENT TYPE:  INP   LOCATION:  2909                         FACILITY:  [facility]   PHYSICIAN:  Rollene Rotunda, MD, FACCDATE OF BIRTH:  03-25-1918   DATE OF ADMISSION:  06/26/2008  DATE OF DISCHARGE:                              HISTORY & PHYSICAL   The primary is Dr. Kirstie Peri.  The cardiologist is none.   REASON FOR PRESENTATION:  The patient with acute coronary syndrome.   HISTORY OF PRESENT ILLNESS:  The patient is a lovely 77 year old white  female without a prior cardiac history.  She was in her usual state of  health until yesterday.  She helped her daughter scatter some mulch.  This was in the heat of the day.  She did get warm and quite fatigued.  She says she felt poorly and went into the house.  She was having some  mild substernal chest discomfort.  She described it as small areas of  pressure.  She had not had this before.  She did have a little  discomfort, she thought in her back and perhaps in her neck.  However,  again she thought all of this was mild.  She was nauseated.  She did not  feel like eating last night.  She did not have radiation to her teeth or  jaw or down into her arms.  She was a little more short of breath than  normal but was not describing any PND or orthopnea.  She did not have  any palpitations, presyncope or syncope.  She took 2 aspirin.  However,  this morning she still felt weak.  She did not describe any chest neck,  back or arm discomfort however.  She was still having nausea.  She  presented to [facility] emergency room.  There an EKG demonstrated 1 mm ST  elevation in II, III, aVF, V3 through V5.  The patient was treated with  aspirin, nitroglycerin, beta blocker.  Again she was not having  discomfort at that time.  Her ST segments eventually resolved with no  diagnostic elevation.  She did have an elevated troponin of 3.78, CK  228, and MB  296.   Prior to this the patient had no cardiac symptoms.  She walks a mile or  more daily.  She says she does not get any discomfort with this.  She  does not have any shortness of breath, PND or orthopnea.  She did have  some tachy palpitations this morning that were brief and recurrent.  However, she has not had any presyncope or syncope.  She has lost some  weight because she has been told she might have some food allergies has  been trying to watch what she eats.   PAST MEDICAL HISTORY:  Hypertension for the last year or so,  fibromyalgia, chronic steroid use (probably related to the fibromyalgia  or questionable history of polymyalgia rheumatica), nephrolithiasis,  cholelithiasis, osteoporosis, degenerative joint disease.   PAST SURGICAL HISTORY:  Hysterectomy, cholecystectomy.   ALLERGIES:  Intolerances to IBUPROFEN, IV CONTRAST DYE, NONSTEROIDALS,  and PENICILLIN.   MEDICATIONS AT HOME:  Prednisone, 81 mg aspirin, lisinopril 10 mg daily.   SOCIAL HISTORY:  She is a widow.  She has children and grandchildren.  She lives alone with her twin sister next door.  She has never smoked  cigarettes and does not drink alcohol.   FAMILY HISTORY:  Noncontributory for early coronary disease.  Her twin  sister did have stents a few years ago.   REVIEW OF SYSTEMS:  Positive for a black stool (she was guaiac negative  in the emergency room).  Negative for all other systems.   PHYSICAL EXAMINATION:  GENERAL APPEARANCE:  The patient is very pleasant  and in no distress.  VITAL SIGNS: Blood pressure 112/83, heart rate 74, afebrile, respiratory  rate 16.  HEENT: Eyes are unremarkable. Pupils are equal, round, reactive to  light. Fundi are not visualized. Oral mucosa remarkable.  NECK: No jugular distention at 45 degrees. Carotid upstrokes brisk and  symmetrical.  No bruits, no thyromegaly.  LYMPHATICS:  No cervical, axillary, or inguinal adenopathy.  LUNGS:  Clear to auscultation  bilaterally.  BACK:  No costovertebral tenderness, kyphoscoliosis.  CHEST:  Unremarkable.  HEART:  PMI not displaced or sustained, S1 and S2 within normal limits.  No S3, no S4, no clicks, rubs, 2/6 apical systolic murmur radiating  slightly out the aortic outflow tract and increasing with the strain  phase of Valsalva, no diastolic murmurs.  ABDOMEN:  Flat, positive bowel sounds normal in frequency and pitch.  No  bruits, rebound or guarding. No midline pulsatile mass, no hepatomegaly,  no splenomegaly.  SKIN:  No rashes, no nodules.  EXTREMITIES:  With 2+ pulses throughout, no edema, no cyanosis, no  clubbing.  NEURO:  Oriented to person, place, and time. Cranial nerves II-XII  grossly intact.  Motor grossly intact.   EKG as above.   LABS:  Sodium 134, potassium 4.4, BUN 20, creatinine 1.2, glucose 137,  WBC 11.2, hematocrit 46.2, platelets 197, BNP 571, CK 228, MB 29.6,  troponin 3.78.   ASSESSMENT/PLAN:  1. Acute coronary syndrome.  The patient had chest pain yesterday.      She did have some ST elevation in the emergency room today.      However, this did resolve and she is now completely pain free.  She      is ruled in for myocardial infarction.  At this point I think it is      reasonable to treat her medically and perform elective cardiac      catheterization unless she has any urgent symptoms or EKG changes.      I will put her on heparin and nitroglycerin IV.  Will use a low-      dose beta blocker as tolerated.  Because her blood pressure is      somewhat low right now I will hold her lisinopril.  I will avoid      Plavix with the possibility of multivessel disease in this elderly      patient.  If she has recurrent symptoms or EKG changes I will      consider 2b3a inhibitor.  2. Hyperglycemia.  The patient has never had this problem before.  We      will follow fasting blood sugar and check hemoglobin A1c.  3. Heart murmur. I suspect hypertrophic cardiomyopathy.  I  suspect      this to be mild.  She will get an echocardiogram because of this  and her elevated BNP.  4. Risk reduction.  Will start simvastatin 40 mg empirically and check      her lipid profile.  5. Intravenous contrast allergy.  She reports this.  Will use Solu-      Medrol and cimetidine for contrast prophylaxis.  6. Longstanding steroid use.  She will continue with current dose of      prednisone and we will remember stress dose steroids as needed.      Rollene Rotunda, MD, [facility]  Electronically Signed     JH/MEDQ  D:  06/26/2008  T:  06/26/2008  Job:  [id number]   cc:   Kirstie Peri, MD

## 2010-05-29 NOTE — Assessment & Plan Note (Signed)
[facility]                          [facility] OFFICE NOTE   [name], [name]                     MRN:          [id number]  DATE:07/13/2008                            DOB:          1918-03-09    PRIMARY CARE PHYSICIAN:  Kirstie Peri, MD   REASON FOR PRESENTATION:  Evaluated the patient with cardiomyopathy.   HISTORY OF PRESENT ILLNESS:  The patient was admitted on June 26, 2008.  She had some chest discomfort.  She subsequently ruled in for a non-ST  segment myocardial infarction.  However, cardiac catheterization  demonstrated no obstructive coronary artery disease.  It was at 30% LAD  stenosis and no other significant disease.  However, she had a  cardiomyopathy with a 30-35% ejection fraction with apical dyskinesis.  The patient was managed medically for this.  Since going home, she has  had 1 episode that she thinks might have been panic.  She presented to  the emergency room and was hypertensive.  She was shaky.  She was  managed medically and discharged home.  She has also had an episode of  diarrhea.  She was treated with medication and I am not exactly sure  which one.  I did review the labs and a C. diff culture was negative.  She still having some slight diarrhea.  She is having some shakiness and  jitteriness.  She has been fatigue.  However, she is not having any of  the neck discomfort which was her anginal equivalent.  She is not having  any significant shortness of breath and denies any PND or orthopnea.  She has had no palpitations, presyncope, or syncope.   PAST MEDICAL HISTORY:  Cardiomyopathy as described, mild coronary  plaque, hypertension, fibromyalgia, chronic steroid use (apparently  secondary to fibromyalgia), nephrolithiasis, cholelithiasis,  osteoporosis, degenerative joint disease, hysterectomy, cholecystectomy.   ALLERGIES:  PENICILLIN and IBUPROFEN.   MEDICATIONS:  1. Lisinopril 5 mg daily.  2. Aspirin  325 mg daily.  3. Zocor 40 mg nightly.  4. Prednisone 5 mg daily.  5. Metoprolol 25 mg daily.  6. Ginkgo biloba.  7. Glucosamine.  8. Omega.   REVIEW OF SYSTEMS:  As stated in the HPI and otherwise negative for  other systems.   PHYSICAL EXAMINATION:  GENERAL:  The patient is pleasant and in no  distress.  VITAL SIGNS:  Blood pressure 145/85, heart rate 72 and regular, weight  109 pounds.  HEENT:  Eyelids are unremarkable, pupils are equal, round, and reactive  to light, fundi not visualized, oral mucosa unremarkable.  NECK:  No  jugular venous distention at 45 degrees, carotid upstroke brisk and  symmetric, no bruits, no thyromegaly.  LYMPHATICS:  No cervical, axillary or inguinal adenopathy.  LUNGS:  Clear to auscultation bilaterally.  BACK:  No costovertebral angle tenderness.  CHEST:  Unremarkable.  HEART:  PMI not displaced or sustained, S1 and S2 within normal limits,  no S3, no S4, no clicks, no rubs, no murmurs.  ABDOMEN:  Flat, positive  bowel sounds.  Normal in frequency and pitch, no bruits, no rebound, no  guarding, no  midline pulsatile mass, no hepatomegaly, no splenomegaly.  SKIN:  No rashes, no nodules.  EXTREMITIES:  Pulses are 2+, no edema.   ASSESSMENT AND PLAN:  Cardiomyopathy.  The patient has a cardiomyopathy,  which is probably a takotsubu.  We are managing this medically.  It  turns out she is only taking her lisinopril when her blood pressure is  up.  I have encouraged her to take this every day.  She can continue on  the other medications as listed.  At this point, I would repeat an  echocardiogram in about 3 months to see if her ejection fraction is  improved.  If we had the opportunity, I will uptitrate her medications,  as though she is worried about her blood pressure dropping.  1. Anxiety.  I asked her to discuss this with her primary physician as      I think this is significant.  She describes stress with finances      and her children.  2.  Hypertension.  Today she is slightly hypertensive and apparently      had hypertensive event few days ago.  Again, her blood pressure      seems to be labile and we will titrate up on her medications as      needed.  3. Followup.  I would like to see her back in 3 months and repeat an      echocardiogram at that time.     Rollene Rotunda, MD, [facility]  Electronically Signed    JH/MedQ  DD: 07/13/2008  DT: 07/14/2008  Job #: [id number]   cc:   Kirstie Peri, MD

## 2012-06-20 DIAGNOSIS — J45909 Unspecified asthma, uncomplicated: Secondary | ICD-10-CM | POA: Insufficient documentation

## 2012-06-20 DIAGNOSIS — R7309 Other abnormal glucose: Secondary | ICD-10-CM | POA: Insufficient documentation

## 2012-06-20 DIAGNOSIS — K589 Irritable bowel syndrome without diarrhea: Secondary | ICD-10-CM | POA: Insufficient documentation

## 2012-06-20 DIAGNOSIS — M810 Age-related osteoporosis without current pathological fracture: Secondary | ICD-10-CM | POA: Insufficient documentation

## 2012-06-20 DIAGNOSIS — E876 Hypokalemia: Secondary | ICD-10-CM | POA: Insufficient documentation

## 2012-06-20 DIAGNOSIS — R519 Headache, unspecified: Secondary | ICD-10-CM | POA: Insufficient documentation

## 2012-07-23 DIAGNOSIS — M791 Myalgia, unspecified site: Secondary | ICD-10-CM | POA: Insufficient documentation

## 2012-07-23 DIAGNOSIS — N19 Unspecified kidney failure: Secondary | ICD-10-CM | POA: Insufficient documentation

## 2012-10-26 DIAGNOSIS — R634 Abnormal weight loss: Secondary | ICD-10-CM | POA: Insufficient documentation

## 2012-10-29 DIAGNOSIS — K769 Liver disease, unspecified: Secondary | ICD-10-CM | POA: Insufficient documentation

## 2013-01-11 DIAGNOSIS — K219 Gastro-esophageal reflux disease without esophagitis: Secondary | ICD-10-CM | POA: Insufficient documentation

## 2013-02-01 DIAGNOSIS — B009 Herpesviral infection, unspecified: Secondary | ICD-10-CM | POA: Insufficient documentation

## 2013-03-30 DIAGNOSIS — F411 Generalized anxiety disorder: Secondary | ICD-10-CM | POA: Insufficient documentation

## 2013-03-30 DIAGNOSIS — M159 Polyosteoarthritis, unspecified: Secondary | ICD-10-CM | POA: Insufficient documentation

## 2013-03-30 DIAGNOSIS — M353 Polymyalgia rheumatica: Secondary | ICD-10-CM | POA: Insufficient documentation

## 2015-08-21 DIAGNOSIS — K529 Noninfective gastroenteritis and colitis, unspecified: Secondary | ICD-10-CM | POA: Insufficient documentation

## 2015-08-22 DIAGNOSIS — E440 Moderate protein-calorie malnutrition: Secondary | ICD-10-CM | POA: Insufficient documentation

## 2016-04-12 ENCOUNTER — Encounter (HOSPITAL_BASED_OUTPATIENT_CLINIC_OR_DEPARTMENT_OTHER): Payer: Self-pay | Admitting: Emergency Medicine

## 2016-04-12 ENCOUNTER — Emergency Department (HOSPITAL_BASED_OUTPATIENT_CLINIC_OR_DEPARTMENT_OTHER): Payer: Medicare Other

## 2016-04-12 ENCOUNTER — Emergency Department (HOSPITAL_BASED_OUTPATIENT_CLINIC_OR_DEPARTMENT_OTHER)
Admission: EM | Admit: 2016-04-12 | Discharge: 2016-04-12 | Disposition: A | Discharge status: Home / Self Care | Payer: Medicare Other | Source: Home / Self Care | Attending: Emergency Medicine | Admitting: Emergency Medicine

## 2016-04-12 DIAGNOSIS — S52572B Other intraarticular fracture of lower end of left radius, initial encounter for open fracture type I or II: Secondary | ICD-10-CM | POA: Diagnosis not present

## 2016-04-12 DIAGNOSIS — F419 Anxiety disorder, unspecified: Secondary | ICD-10-CM

## 2016-04-12 DIAGNOSIS — S62109A Fracture of unspecified carpal bone, unspecified wrist, initial encounter for closed fracture: Secondary | ICD-10-CM | POA: Diagnosis not present

## 2016-04-12 DIAGNOSIS — Z79899 Other long term (current) drug therapy: Secondary | ICD-10-CM | POA: Insufficient documentation

## 2016-04-12 DIAGNOSIS — R110 Nausea: Secondary | ICD-10-CM

## 2016-04-12 DIAGNOSIS — I10 Essential (primary) hypertension: Secondary | ICD-10-CM

## 2016-04-12 LAB — CBC WITH DIFFERENTIAL/PLATELET
BASOS ABS: 0 10*3/uL (ref 0.0–0.1)
Basophils Relative: 0 %
Eosinophils Absolute: 0 10*3/uL (ref 0.0–0.7)
Eosinophils Relative: 0 %
HCT: 40.6 % (ref 36.0–46.0)
HEMOGLOBIN: 13.3 g/dL (ref 12.0–15.0)
LYMPHS ABS: 1.3 10*3/uL (ref 0.7–4.0)
Lymphocytes Relative: 17 %
MCH: 31.4 pg (ref 26.0–34.0)
MCHC: 32.8 g/dL (ref 30.0–36.0)
MCV: 96 fL (ref 78.0–100.0)
MONO ABS: 1 10*3/uL (ref 0.1–1.0)
MONOS PCT: 13 %
NEUTROS ABS: 5.4 10*3/uL (ref 1.7–7.7)
NEUTROS PCT: 70 %
Platelets: 176 10*3/uL (ref 150–400)
RBC: 4.23 MIL/uL (ref 3.87–5.11)
RDW: 13.4 % (ref 11.5–15.5)
WBC: 7.7 10*3/uL (ref 4.0–10.5)

## 2016-04-12 LAB — BASIC METABOLIC PANEL
Anion gap: 8 (ref 5–15)
BUN: 20 mg/dL (ref 6–20)
CHLORIDE: 105 mmol/L (ref 101–111)
CO2: 28 mmol/L (ref 22–32)
CREATININE: 1.22 mg/dL — AB (ref 0.44–1.00)
Calcium: 9.2 mg/dL (ref 8.9–10.3)
GFR calc non Af Amer: 36 mL/min — ABNORMAL LOW (ref >60)
GFR, EST AFRICAN AMERICAN: 42 mL/min — AB (ref >60)
Glucose, Bld: 117 mg/dL — ABNORMAL HIGH (ref 65–99)
Potassium: 3.7 mmol/L (ref 3.5–5.1)
SODIUM: 141 mmol/L (ref 135–145)

## 2016-04-12 LAB — URINALYSIS, ROUTINE W REFLEX MICROSCOPIC
Bilirubin Urine: NEGATIVE
GLUCOSE, UA: NEGATIVE mg/dL
HGB URINE DIPSTICK: NEGATIVE
Ketones, ur: NEGATIVE mg/dL
LEUKOCYTES UA: NEGATIVE
Nitrite: NEGATIVE
Protein, ur: NEGATIVE mg/dL
SPECIFIC GRAVITY, URINE: 1.006 (ref 1.005–1.030)
pH: 5.5 (ref 5.0–8.0)

## 2016-04-12 LAB — CBG MONITORING, ED: Glucose-Capillary: 113 mg/dL — ABNORMAL HIGH (ref 65–99)

## 2016-04-12 MED ORDER — SODIUM CHLORIDE 0.9 % IV BOLUS (SEPSIS)
500.0000 mL | Freq: Once | INTRAVENOUS | Status: AC
  Administered 2016-04-12: 500 mL via INTRAVENOUS

## 2016-04-12 MED ORDER — ONDANSETRON HCL 4 MG/2ML IJ SOLN
4.0000 mg | Freq: Once | INTRAMUSCULAR | Status: AC
  Administered 2016-04-12: 4 mg via INTRAVENOUS
  Filled 2016-04-12: qty 2

## 2016-04-12 NOTE — ED Notes (Addendum)
Pt complains of a headache, ringing ears and increased nausea. Pt states she feels like she is burning up and cant stop shaking. Pt states her hands are tingling.  Face presents flushed. Pt has shaky upper extremities. Neuro intact.

## 2016-04-12 NOTE — Discharge Instructions (Signed)
Return to the ED with any concerns including fever/chills, vomiting and not able to keep down liquids, chest pain, difficulty breathing, fainting, decreased level of alertness/lethargy, or any other alarming symptoms

## 2016-04-12 NOTE — ED Notes (Signed)
ED Provider at bedside. 

## 2016-04-12 NOTE — ED Provider Notes (Signed)
[facility] Note   CSN: [id number] Arrival date & time: 04/12/16  1800  By signing my name below, I, Doreatha Martin, attest that this documentation has been prepared under the direction and in the presence of Jerelyn Scott, MD. Electronically Signed: Doreatha Martin, ED Scribe. 04/12/16. 8:26 PM.    History   Chief Complaint Chief Complaint  Patient presents with  . "doesn't feel right"    HPI [name] is a 83 y.o. female who presents to the Emergency Department for evaluation of improving altered mental status that began today. Per daughter, the pt has "not been herself" today, and has had nausea and diarrhea worsened from her baseline of chronic nausea and loose diarrhea. Daughter also states the pt had lapses in memory, complained of ear pain, was visibly flushed, complained of mild HA and transiently began to shake and feel jittery and seemed anxious later in the day. Pt states she woke up nauseated, felt "hot" and generally felt "unwell". Pt also complains of lightheadedness that is worsened with standing up. Daughter states the pt seems to be forgetting things that happened today, but has not been confused or having slurred speech. Per daughter, the pts mental status has improved throughout the day, she is not jittery and is less anxious than earlier today. Daughter states the pt was having similar memory issues months ago, but this resolved completely. Pt states the pt was otherwise well throughout the week. Per pt, she has been eating and drinking normally today. Daughter states the pt has h/o similar symptoms and was dehydrated at that time. Daughter states the pt does not drink a lot of fluids at baseline and her diarrhea is not significantly worse. No worsening or alleviating factors noted. Pt denies weakness, emesis, bloody stools, slurred speech.  No fever/chills.  No focal weakness.  No changes in vision or speech.  No chest pain or shortness of breath.    PCP  [facility] Dr[facility] Manson [facility]   The history is provided by the patient and a relative. No language interpreter was used.  Headache   This is a new problem. The current episode started 12 to 24 hours ago. The problem occurs constantly. The problem has not changed since onset.The headache is associated with nothing. The pain is mild. The pain does not radiate. Associated symptoms include a fever (subjective) and nausea. Pertinent negatives include no vomiting. She has tried nothing for the symptoms. The treatment provided no relief.    Past Medical History:  Diagnosis Date  . Cardiac arrhythmia   . Cholelithiasis   . DJD (degenerative joint disease)   . Esophageal stricture   . Fibromyalgia   . Hypertension   . Kidney disease   . Osteoarthritis   . Osteoporosis   . Renal calculi   . Sinusitis   . Takotsubo cardiomyopathy 2009    Patient Active Problem List   Diagnosis Date Noted  . PURE HYPERCHOLESTEROLEMIA 10/25/2008  . HYPERTENSION 10/25/2008  . INTERMEDIATE CORONARY SYNDROME 10/25/2008  . CARDIOMYOPATHY 10/25/2008  . DIAPHRAGMAT HERN W/O MENTION OBSTRUCTION/GANGREN 10/25/2008  . RHEUMATOID ARTHRITIS 10/25/2008  . OTHER MALAISE AND FATIGUE 10/25/2008  . SHORTNESS OF BREATH 10/25/2008  . COUGH 10/25/2008    Past Surgical History:  Procedure Laterality Date  . ABDOMINAL HYSTERECTOMY    . CHOLECYSTECTOMY    . EYE SURGERY      OB History    No data available       Home Medications  Prior to Admission medications   Medication Sig Start Date End Date Taking? Authorizing Provider  Calcium 1200-1000 MG-UNIT CHEW Chew by mouth. 1 chew daily (doesn't take everyday)     Historical Provider, MD  furosemide (LASIX) 20 MG tablet Take 20 mg by mouth at bedtime.      Historical Provider, MD  HYDROcodone-acetaminophen (NORCO) 5-325 MG per tablet Take 1 tablet by mouth 2 (two) times daily as needed.      Historical Provider, MD  lisinopril (PRINIVIL,ZESTRIL) 5 MG tablet Take 5  mg by mouth daily.      Historical Provider, MD  loperamide (IMODIUM) 2 MG capsule Take 2 mg by mouth as needed.      Historical Provider, MD  loratadine (CLARITIN) 10 MG tablet Take 10 mg by mouth daily.      Historical Provider, MD  metoprolol succinate (TOPROL-XL) 25 MG 24 hr tablet Take 25 mg by mouth daily.      Historical Provider, MD  Multiple Vitamins-Minerals (EYE-VITES PO) Take by mouth daily. Take 1 tablet by mouth daily     Historical Provider, MD  ondansetron (ZOFRAN) 4 MG tablet Take 4 mg by mouth as needed.      Historical Provider, MD  pantoprazole (PROTONIX) 40 MG tablet Take 40 mg by mouth daily.      Historical Provider, MD  predniSONE (DELTASONE) 5 MG tablet Take 5 mg by mouth daily.      Historical Provider, MD  simvastatin (ZOCOR) 40 MG tablet Take 40 mg by mouth daily.      Historical Provider, MD    Family History Family History  Problem Relation Age of Onset  . Colon cancer Neg Hx     Social History Social History  Substance Use Topics  . Smoking status: Never Smoker  . Smokeless tobacco: Never Used  . Alcohol use No     Allergies   Iohexol and Penicillins   Review of Systems Review of Systems  Constitutional: Positive for chills and fever (subjective).  HENT: Positive for ear pain.   Gastrointestinal: Positive for diarrhea and nausea. Negative for blood in stool and vomiting.  Neurological: Positive for light-headedness and headaches. Negative for speech difficulty and weakness.  All other systems reviewed and are negative.   Physical Exam Updated Vital Signs BP 140/72   Pulse 74   Temp 98 F (36.7 C) (Oral)   Resp 13   Ht 5' (1.524 m)   Wt 93 lb (42.2 kg)   SpO2 95%   BMI 18.16 kg/m  Vitals reviewed Physical Exam Physical Examination: General appearance - alert, well appearing, and in no distress, pleasant, smiling, talkative Mental status - alert, oriented to person, place, and time Eyes - pupils equal and reactive, extraocular eye  movements intact Mouth - mucous membranes moist, pharynx normal without lesions Neck - supple, no significant adenopathy Chest - clear to auscultation, no wheezes, rales or rhonchi, symmetric air entry Heart - normal rate, regular rhythm, normal S1, S2, no murmurs, rubs, clicks or gallops Abdomen - soft, nontender, nondistended, no masses or organomegaly Neurological - alert, oriented x 3, normal speech, cranial nerves 2-12 tested and intact, strength 5/5 in extremities x 4, sensation intact Extremities - peripheral pulses normal, no pedal edema, no clubbing or cyanosis Skin - normal coloration and turgor, no rashes  ED Treatments / Results   DIAGNOSTIC STUDIES: Oxygen Saturation is 93% on RA, adequate by my interpretation.    COORDINATION OF CARE: 8:18 PM Discussed treatment plan with  pt at bedside which includes labs and pt agreed to plan.    Labs (all labs ordered are listed, but only abnormal results are displayed) Labs Reviewed  BASIC METABOLIC PANEL - Abnormal; Notable for the following:       Result Value   Glucose, Bld 117 (*)    Creatinine, Ser 1.22 (*)    GFR calc non Af Amer 36 (*)    GFR calc Af Amer 42 (*)    All other components within normal limits  CBG MONITORING, ED - Abnormal; Notable for the following:    Glucose-Capillary 113 (*)    All other components within normal limits  URINE CULTURE  URINALYSIS, ROUTINE W REFLEX MICROSCOPIC  CBC WITH DIFFERENTIAL/PLATELET    EKG  EKG Interpretation  Date/Time:  Friday April 12 2016 20:34:39 EDT Ventricular Rate:  79 PR Interval:    QRS Duration: 120 QT Interval:  429 QTC Calculation: 492 R Axis:   87 Text Interpretation:  Sinus rhythm IVCD, consider atypical RBBB No significant change since last tracing Confirmed by Karma Ganja  MD, Deborahann Poteat [phone]) on 04/12/2016 9:12:56 PM       Radiology Ct Head Wo Contrast  Result Date: 04/12/2016 CLINICAL DATA:  83 year old with altered mental status. Headache today. EXAM:  CT HEAD WITHOUT CONTRAST TECHNIQUE: Contiguous axial images were obtained from the base of the skull through the vertex without intravenous contrast. COMPARISON:  None. FINDINGS: Brain: Generalized cerebral atrophy. Mild for age chronic small vessel ischemia. Remote lacunar infarct in the left basal ganglia versus prominent perivascular space. No hemorrhage. No CT findings of acute ischemia. No subdural or extra-axial collection. No hydrocephalus, mass effect or midline shift. Vascular: Atherosclerosis of skullbase vasculature without hyperdense vessel or abnormal calcification. Skull: Normal. Negative for fracture or focal lesion. Sinuses/Orbits: Mucous retention cyst in the sphenoid sinus with mild mucosal thickening. Mild mucosal thickening of right sphenoid sinus. Mastoid air cells are well-aerated. Visualized orbits are unremarkable. Other: None. IMPRESSION: 1.  No acute intracranial abnormality. 2. Generalized atrophy and mild chronic small vessel ischemia. Remote lacunar infarct in the left basal ganglia. Electronically Signed   By: Rubye Oaks M.D.   On: 04/12/2016 21:30    Procedures Procedures (including critical care time)  Medications Ordered in ED Medications  sodium chloride 0.9 % bolus 500 mL (0 mLs Intravenous Stopped 04/12/16 2238)  ondansetron (ZOFRAN) injection 4 mg (4 mg Intravenous Given 04/12/16 2143)     Initial Impression / Assessment and Plan / ED Course  I have reviewed the triage vital signs and the nursing notes.  Pertinent labs & imaging results that were available during my care of the patient were reviewed by me and considered in my medical decision making (see chart for details).    11:08 PM pt is feeling much better, she is no longer shaky, she is more talkative, she is laughing and joking with her daughter.  Daughter is very comfortable taking her home.   I have discusssed all results with her and daughter at the bedside.    Pt presenting with c/o symptoms  most c/w anxiety, some confusion.  I have discussed all findings with daughter and patient at bedside- no findings of UTI, acute stroke, MI, electrolyte abnormality, anemia.  Feel that hospital admission will not be of much benefit as she has become encephalopathic in the hosptial in the past.  Symptoms have resolved.  May have been some mild dehdyration.  May have had an anxiety component.  Daughter and patient are  comfortable with going home and will f/u with PMD.  Daughter has a rx for xanax for patient and will try to give this if symptoms return.  Discharged with strict return precautions.  Pt agreeable with plan.  Final Clinical Impressions(s) / ED Diagnoses   Final diagnoses:  Nausea  Anxiety    New Prescriptions Discharge Medication List as of 04/12/2016 11:07 PM      I personally performed the services described in this documentation, which was scribed in my presence. The recorded information has been reviewed and is accurate.     Jerelyn Scott, MD 04/13/16 [phone]

## 2016-04-12 NOTE — ED Notes (Signed)
ED Provider at bedside discussing test results and dispo plan of care. 

## 2016-04-12 NOTE — ED Triage Notes (Signed)
Patient A&O, accompanied by her daughter with complaint of "being in crisis" for the past several hours. Patient states she has been getting confused and just feels weird. Daughter states the patient has been nauseated, flushed, having trouble breathing, and "in crisis" today. C/O diarrhea x2( which is normal for patient) in prior 24hours, no emesis. Patient is speaking clearly in complete sentences without distress.

## 2016-04-12 NOTE — ED Notes (Addendum)
Pt does not recall events this morning at [facility]. Daughter states this happened in December but resolved. Over the last few days, memory has declined. Daughter also states the patient is not eating and drinking regularly.

## 2016-04-13 ENCOUNTER — Emergency Department (HOSPITAL_BASED_OUTPATIENT_CLINIC_OR_DEPARTMENT_OTHER): Payer: Medicare Other

## 2016-04-13 ENCOUNTER — Inpatient Hospital Stay (HOSPITAL_BASED_OUTPATIENT_CLINIC_OR_DEPARTMENT_OTHER)
Admission: EM | Admit: 2016-04-13 | Discharge: 2016-04-21 | DRG: 501 | Disposition: A | Discharge status: Home / Self Care | Payer: Medicare Other | Attending: Internal Medicine | Admitting: Internal Medicine

## 2016-04-13 ENCOUNTER — Encounter (HOSPITAL_BASED_OUTPATIENT_CLINIC_OR_DEPARTMENT_OTHER): Payer: Self-pay | Admitting: Emergency Medicine

## 2016-04-13 ENCOUNTER — Inpatient Hospital Stay (HOSPITAL_BASED_OUTPATIENT_CLINIC_OR_DEPARTMENT_OTHER): Payer: Medicare Other

## 2016-04-13 DIAGNOSIS — D72829 Elevated white blood cell count, unspecified: Secondary | ICD-10-CM | POA: Diagnosis present

## 2016-04-13 DIAGNOSIS — Z79899 Other long term (current) drug therapy: Secondary | ICD-10-CM | POA: Diagnosis not present

## 2016-04-13 DIAGNOSIS — K580 Irritable bowel syndrome with diarrhea: Secondary | ICD-10-CM | POA: Diagnosis not present

## 2016-04-13 DIAGNOSIS — M19041 Primary osteoarthritis, right hand: Secondary | ICD-10-CM | POA: Diagnosis present

## 2016-04-13 DIAGNOSIS — E440 Moderate protein-calorie malnutrition: Secondary | ICD-10-CM | POA: Diagnosis present

## 2016-04-13 DIAGNOSIS — S42209A Unspecified fracture of upper end of unspecified humerus, initial encounter for closed fracture: Secondary | ICD-10-CM

## 2016-04-13 DIAGNOSIS — N179 Acute kidney failure, unspecified: Secondary | ICD-10-CM | POA: Diagnosis present

## 2016-04-13 DIAGNOSIS — Z01818 Encounter for other preprocedural examination: Secondary | ICD-10-CM | POA: Diagnosis not present

## 2016-04-13 DIAGNOSIS — S62102A Fracture of unspecified carpal bone, left wrist, initial encounter for closed fracture: Secondary | ICD-10-CM | POA: Diagnosis not present

## 2016-04-13 DIAGNOSIS — I429 Cardiomyopathy, unspecified: Secondary | ICD-10-CM | POA: Diagnosis present

## 2016-04-13 DIAGNOSIS — S62102B Fracture of unspecified carpal bone, left wrist, initial encounter for open fracture: Secondary | ICD-10-CM | POA: Diagnosis not present

## 2016-04-13 DIAGNOSIS — I350 Nonrheumatic aortic (valve) stenosis: Secondary | ICD-10-CM | POA: Diagnosis present

## 2016-04-13 DIAGNOSIS — S52572B Other intraarticular fracture of lower end of left radius, initial encounter for open fracture type I or II: Principal | ICD-10-CM | POA: Diagnosis present

## 2016-04-13 DIAGNOSIS — Z87442 Personal history of urinary calculi: Secondary | ICD-10-CM

## 2016-04-13 DIAGNOSIS — Z7952 Long term (current) use of systemic steroids: Secondary | ICD-10-CM

## 2016-04-13 DIAGNOSIS — D696 Thrombocytopenia, unspecified: Secondary | ICD-10-CM | POA: Diagnosis present

## 2016-04-13 DIAGNOSIS — M25512 Pain in left shoulder: Secondary | ICD-10-CM | POA: Diagnosis not present

## 2016-04-13 DIAGNOSIS — M810 Age-related osteoporosis without current pathological fracture: Secondary | ICD-10-CM | POA: Diagnosis present

## 2016-04-13 DIAGNOSIS — M353 Polymyalgia rheumatica: Secondary | ICD-10-CM | POA: Diagnosis present

## 2016-04-13 DIAGNOSIS — Z90710 Acquired absence of both cervix and uterus: Secondary | ICD-10-CM | POA: Diagnosis not present

## 2016-04-13 DIAGNOSIS — Z9049 Acquired absence of other specified parts of digestive tract: Secondary | ICD-10-CM | POA: Diagnosis not present

## 2016-04-13 DIAGNOSIS — M797 Fibromyalgia: Secondary | ICD-10-CM | POA: Diagnosis present

## 2016-04-13 DIAGNOSIS — R55 Syncope and collapse: Secondary | ICD-10-CM | POA: Diagnosis not present

## 2016-04-13 DIAGNOSIS — S42232A 3-part fracture of surgical neck of left humerus, initial encounter for closed fracture: Secondary | ICD-10-CM | POA: Diagnosis present

## 2016-04-13 DIAGNOSIS — E876 Hypokalemia: Secondary | ICD-10-CM | POA: Diagnosis present

## 2016-04-13 DIAGNOSIS — S62109A Fracture of unspecified carpal bone, unspecified wrist, initial encounter for closed fracture: Secondary | ICD-10-CM | POA: Diagnosis not present

## 2016-04-13 DIAGNOSIS — I129 Hypertensive chronic kidney disease with stage 1 through stage 4 chronic kidney disease, or unspecified chronic kidney disease: Secondary | ICD-10-CM | POA: Diagnosis present

## 2016-04-13 DIAGNOSIS — M80022A Age-related osteoporosis with current pathological fracture, left humerus, initial encounter for fracture: Secondary | ICD-10-CM | POA: Diagnosis not present

## 2016-04-13 DIAGNOSIS — M1990 Unspecified osteoarthritis, unspecified site: Secondary | ICD-10-CM | POA: Diagnosis not present

## 2016-04-13 DIAGNOSIS — R197 Diarrhea, unspecified: Secondary | ICD-10-CM | POA: Diagnosis present

## 2016-04-13 DIAGNOSIS — E785 Hyperlipidemia, unspecified: Secondary | ICD-10-CM | POA: Diagnosis present

## 2016-04-13 DIAGNOSIS — S42302A Unspecified fracture of shaft of humerus, left arm, initial encounter for closed fracture: Secondary | ICD-10-CM | POA: Diagnosis not present

## 2016-04-13 DIAGNOSIS — N183 Chronic kidney disease, stage 3 (moderate): Secondary | ICD-10-CM | POA: Diagnosis present

## 2016-04-13 DIAGNOSIS — R410 Disorientation, unspecified: Secondary | ICD-10-CM | POA: Diagnosis not present

## 2016-04-13 DIAGNOSIS — Z23 Encounter for immunization: Secondary | ICD-10-CM | POA: Diagnosis present

## 2016-04-13 DIAGNOSIS — S42292B Other displaced fracture of upper end of left humerus, initial encounter for open fracture: Secondary | ICD-10-CM | POA: Diagnosis not present

## 2016-04-13 DIAGNOSIS — J9811 Atelectasis: Secondary | ICD-10-CM | POA: Diagnosis present

## 2016-04-13 DIAGNOSIS — Z880 Allergy status to penicillin: Secondary | ICD-10-CM | POA: Diagnosis not present

## 2016-04-13 DIAGNOSIS — I5181 Takotsubo syndrome: Secondary | ICD-10-CM | POA: Diagnosis not present

## 2016-04-13 DIAGNOSIS — D62 Acute posthemorrhagic anemia: Secondary | ICD-10-CM | POA: Diagnosis present

## 2016-04-13 DIAGNOSIS — I10 Essential (primary) hypertension: Secondary | ICD-10-CM | POA: Diagnosis not present

## 2016-04-13 DIAGNOSIS — M80032A Age-related osteoporosis with current pathological fracture, left forearm, initial encounter for fracture: Secondary | ICD-10-CM | POA: Diagnosis not present

## 2016-04-13 DIAGNOSIS — Z888 Allergy status to other drugs, medicaments and biological substances status: Secondary | ICD-10-CM

## 2016-04-13 DIAGNOSIS — W010XXA Fall on same level from slipping, tripping and stumbling without subsequent striking against object, initial encounter: Secondary | ICD-10-CM | POA: Diagnosis present

## 2016-04-13 LAB — CBC WITH DIFFERENTIAL/PLATELET
BASOS PCT: 0 %
Basophils Absolute: 0 10*3/uL (ref 0.0–0.1)
EOS ABS: 0 10*3/uL (ref 0.0–0.7)
EOS PCT: 0 %
HCT: 37.6 % (ref 36.0–46.0)
HEMOGLOBIN: 12 g/dL (ref 12.0–15.0)
Lymphocytes Relative: 10 %
Lymphs Abs: 1.3 10*3/uL (ref 0.7–4.0)
MCH: 31.2 pg (ref 26.0–34.0)
MCHC: 31.9 g/dL (ref 30.0–36.0)
MCV: 97.7 fL (ref 78.0–100.0)
MONOS PCT: 10 %
Monocytes Absolute: 1.2 10*3/uL — ABNORMAL HIGH (ref 0.1–1.0)
NEUTROS PCT: 80 %
Neutro Abs: 10.1 10*3/uL — ABNORMAL HIGH (ref 1.7–7.7)
PLATELETS: 157 10*3/uL (ref 150–400)
RBC: 3.85 MIL/uL — ABNORMAL LOW (ref 3.87–5.11)
RDW: 13.5 % (ref 11.5–15.5)
WBC: 12.7 10*3/uL — ABNORMAL HIGH (ref 4.0–10.5)

## 2016-04-13 LAB — BASIC METABOLIC PANEL
Anion gap: 6 (ref 5–15)
BUN: 19 mg/dL (ref 6–20)
CALCIUM: 9 mg/dL (ref 8.9–10.3)
CO2: 27 mmol/L (ref 22–32)
Chloride: 107 mmol/L (ref 101–111)
Creatinine, Ser: 1.22 mg/dL — ABNORMAL HIGH (ref 0.44–1.00)
GFR calc Af Amer: 42 mL/min — ABNORMAL LOW (ref >60)
GFR, EST NON AFRICAN AMERICAN: 36 mL/min — AB (ref >60)
GLUCOSE: 125 mg/dL — AB (ref 65–99)
Potassium: 4.4 mmol/L (ref 3.5–5.1)
Sodium: 140 mmol/L (ref 135–145)

## 2016-04-13 LAB — PROTIME-INR
INR: 0.94
PROTHROMBIN TIME: 12.6 s (ref 11.4–15.2)

## 2016-04-13 MED ORDER — CEFAZOLIN IN D5W 1 GM/50ML IV SOLN
1.0000 g | Freq: Once | INTRAVENOUS | Status: AC
  Administered 2016-04-13: 1 g via INTRAVENOUS
  Filled 2016-04-13: qty 50

## 2016-04-13 MED ORDER — MORPHINE SULFATE (PF) 2 MG/ML IV SOLN
2.0000 mg | Freq: Once | INTRAVENOUS | Status: AC
  Administered 2016-04-13: 2 mg via INTRAVENOUS
  Filled 2016-04-13: qty 1

## 2016-04-13 MED ORDER — HYDROMORPHONE HCL 1 MG/ML IJ SOLN
1.0000 mg | Freq: Once | INTRAMUSCULAR | Status: AC
  Administered 2016-04-13: 1 mg via INTRAVENOUS
  Filled 2016-04-13: qty 1

## 2016-04-13 MED ORDER — TETANUS-DIPHTH-ACELL PERTUSSIS 5-2.5-18.5 LF-MCG/0.5 IM SUSP
0.5000 mL | Freq: Once | INTRAMUSCULAR | Status: AC
  Administered 2016-04-13: 0.5 mL via INTRAMUSCULAR
  Filled 2016-04-13: qty 0.5

## 2016-04-13 NOTE — ED Provider Notes (Signed)
[facility] Provider Note   CSN: [id number] Arrival date & time: 04/13/16  2030  By signing my name below, I, [name], attest that this documentation has been prepared under the direction and in the presence of Geoffery Lyons, MD. Electronically Signed: [name], Scribe. 04/13/16. 8:54 PM.  History   Chief Complaint Chief Complaint  Patient presents with  . Fall   HPI Comments: [name] is a 83 y.o. female with a history of HTN, CKD, and anxiety, who presents to the Emergency Department complaining of sudden-onset, constant left arm pain s/p mechanical fall that happened earlier today. Patient states she tripped over the dog leash and fell in the road, injuring her left arm. Patient was here last night for nausea and diarrhea, in which she was diagnosed mild dehydration, was given IV fluids and xanax. Patient reports associated left shoulder pain and left arm bleeding. Patient reports applying ice to the area with minimal relief. Patient denies any nausea, vomiting, loss of bowel/bladder control, numbness, or weakness.   The history is provided by the patient. No language interpreter was used.    Past Medical History:  Diagnosis Date  . Cardiac arrhythmia   . Cholelithiasis   . DJD (degenerative joint disease)   . Esophageal stricture   . Fibromyalgia   . Hypertension   . Kidney disease   . Osteoarthritis   . Osteoporosis   . Renal calculi   . Sinusitis   . Takotsubo cardiomyopathy 2009    Patient Active Problem List   Diagnosis Date Noted  . PURE HYPERCHOLESTEROLEMIA 10/25/2008  . HYPERTENSION 10/25/2008  . INTERMEDIATE CORONARY SYNDROME 10/25/2008  . CARDIOMYOPATHY 10/25/2008  . DIAPHRAGMAT HERN W/O MENTION OBSTRUCTION/GANGREN 10/25/2008  . RHEUMATOID ARTHRITIS 10/25/2008  . OTHER MALAISE AND FATIGUE 10/25/2008  . SHORTNESS OF BREATH 10/25/2008  . COUGH 10/25/2008    Past Surgical History:  Procedure Laterality Date  . ABDOMINAL  HYSTERECTOMY    . CHOLECYSTECTOMY    . EYE SURGERY      OB History    No data available       Home Medications    Prior to Admission medications   Medication Sig Start Date End Date Taking? Authorizing Provider  Calcium 1200-1000 MG-UNIT CHEW Chew by mouth. 1 chew daily (doesn't take everyday)     Historical Provider, MD  furosemide (LASIX) 20 MG tablet Take 20 mg by mouth at bedtime.      Historical Provider, MD  HYDROcodone-acetaminophen (NORCO) 5-325 MG per tablet Take 1 tablet by mouth 2 (two) times daily as needed.      Historical Provider, MD  lisinopril (PRINIVIL,ZESTRIL) 5 MG tablet Take 5 mg by mouth daily.      Historical Provider, MD  loperamide (IMODIUM) 2 MG capsule Take 2 mg by mouth as needed.      Historical Provider, MD  loratadine (CLARITIN) 10 MG tablet Take 10 mg by mouth daily.      Historical Provider, MD  metoprolol succinate (TOPROL-XL) 25 MG 24 hr tablet Take 25 mg by mouth daily.      Historical Provider, MD  Multiple Vitamins-Minerals (EYE-VITES PO) Take by mouth daily. Take 1 tablet by mouth daily     Historical Provider, MD  ondansetron (ZOFRAN) 4 MG tablet Take 4 mg by mouth as needed.      Historical Provider, MD  pantoprazole (PROTONIX) 40 MG tablet Take 40 mg by mouth daily.      Historical Provider, MD  predniSONE (DELTASONE)  5 MG tablet Take 5 mg by mouth daily.      Historical Provider, MD  simvastatin (ZOCOR) 40 MG tablet Take 40 mg by mouth daily.      Historical Provider, MD    Family History Family History  Problem Relation Age of Onset  . Colon cancer Neg Hx     Social History Social History  Substance Use Topics  . Smoking status: Never Smoker  . Smokeless tobacco: Never Used  . Alcohol use No     Allergies   Iohexol and Penicillins   Review of Systems Review of Systems  Gastrointestinal: Negative for nausea and vomiting.  Musculoskeletal: Positive for arthralgias (left arm, left shoulder).  Skin: Positive for color change  and wound (left arm).  Neurological: Negative for weakness and numbness.     Physical Exam Updated Vital Signs There were no vitals taken for this visit.  Physical Exam  Constitutional: She is oriented to person, place, and time. She appears well-developed and well-nourished. No distress.  HENT:  Head: Normocephalic and atraumatic.  Eyes: EOM are normal.  Neck: Normal range of motion.  Cardiovascular: Normal rate, regular rhythm and normal heart sounds.   Pulmonary/Chest: Effort normal and breath sounds normal.  Abdominal: Soft. She exhibits no distension. There is no tenderness.  Musculoskeletal: Normal range of motion.  Left wrist has a deep skin tear/laceration with exposed tendons and deformity. Suggestive of a open fracture. Cap refill is brick. Sensation is intact all fingers.   There is a swollen ecchymotic area to the anterior left shoulder. She has good ROM with some discomfort.   Neurological: She is alert and oriented to person, place, and time.  Skin: Skin is warm and dry.  Psychiatric: She has a normal mood and affect. Judgment normal.  Nursing note and vitals reviewed.         ED Treatments / Results  COORDINATION OF CARE: 8:53 PM Discussed treatment plan with pt at bedside and pt agreed to plan, which includes labs, imaging, and pain medication.   Labs (all labs ordered are listed, but only abnormal results are displayed) Labs Reviewed  BASIC METABOLIC PANEL  CBC WITH DIFFERENTIAL/PLATELET  PROTIME-INR    EKG  EKG Interpretation None       Radiology Ct Head Wo Contrast  Result Date: 04/12/2016 CLINICAL DATA:  83 year old with altered mental status. Headache today. EXAM: CT HEAD WITHOUT CONTRAST TECHNIQUE: Contiguous axial images were obtained from the base of the skull through the vertex without intravenous contrast. COMPARISON:  None. FINDINGS: Brain: Generalized cerebral atrophy. Mild for age chronic small vessel ischemia. Remote lacunar  infarct in the left basal ganglia versus prominent perivascular space. No hemorrhage. No CT findings of acute ischemia. No subdural or extra-axial collection. No hydrocephalus, mass effect or midline shift. Vascular: Atherosclerosis of skullbase vasculature without hyperdense vessel or abnormal calcification. Skull: Normal. Negative for fracture or focal lesion. Sinuses/Orbits: Mucous retention cyst in the sphenoid sinus with mild mucosal thickening. Mild mucosal thickening of right sphenoid sinus. Mastoid air cells are well-aerated. Visualized orbits are unremarkable. Other: None. IMPRESSION: 1.  No acute intracranial abnormality. 2. Generalized atrophy and mild chronic small vessel ischemia. Remote lacunar infarct in the left basal ganglia. Electronically Signed   By: Rubye Oaks M.D.   On: 04/12/2016 21:30    Procedures Procedures (including critical care time)  Medications Ordered in ED Medications  morphine 2 MG/ML injection 2 mg (2 mg Intravenous Given 04/13/16 2054)     Initial Impression /  Assessment and Plan / ED Course  I have reviewed the triage vital signs and the nursing notes.  Pertinent labs & imaging results that were available during my care of the patient were reviewed by me and considered in my medical decision making (see chart for details).  Patient presents after a fall. She has a proximal humerus fracture which is closed, and a displaced distal radius fracture which is open. She was given tetanus booster and Salt. I have discussed this with Dr. Melvyn Novas from hand surgery who is recommending admission to the hospitalist service and surgical repair first thing tomorrow morning. The wound was irrigated, a Xeroform dressing was applied, and the fracture splinted. Patient will be admitted to the hospitalist service under the care of Dr. Antionette Char.  Final Clinical Impressions(s) / ED Diagnoses   Final diagnoses:  None    New Prescriptions New Prescriptions   No medications  on file   I personally performed the services described in this documentation, which was scribed in my presence. The recorded information has been reviewed and is accurate.        Geoffery Lyons, MD 04/13/16 [id number]

## 2016-04-13 NOTE — ED Notes (Signed)
Cardboard splint applied to L forearm for stabilization.

## 2016-04-13 NOTE — ED Triage Notes (Signed)
Pt presents to ED with complaints of left arm pain after falling today left forearm bleeding and deformity to left wrist and left upper arm. PT states she tripped

## 2016-04-13 NOTE — ED Notes (Addendum)
LUE reassessed. 2+ radial pulse, cap refill <3 sec, paraesthesia present (EDP aware).

## 2016-04-13 NOTE — ED Notes (Signed)
Reassessed radial pulse immediately after irrigation and bandaging of wound. 2+ radial, sensation intact, questionable paresthesia (dementia present). After final splinting unable to assess radial pulse due to splint, but cap refill remains brisk at < 3 sec.

## 2016-04-13 NOTE — Consult Note (Signed)
FILMS REVIEWED PT WITH COMMINUTED DISTAL RADIUS FRACTURE AND IPSILATERAL PROXIMAL HUMERUS FRACTURE WILL NEED STABILIZATION OF DISTAL RADIUS FRACTURE INSTRUCTED ED TO WASHOUT DORSAL WOUND, REDUCE AND SPLINT WILL NEED TRANSFER TO [place], HOSPITALIST EVAL FOR MEDICAL EVAL AND PREOP ASSESSMENT FOR SURGERY LIKELY 04/14/2016 PLAN FOR I AND D AN LIKELY ORIF OF LEFT DISTAL RADIUS 04/14/2016 PENDING MEDICINE CLEARANCE. WILL SEE PATIENT AT [place] IN AM

## 2016-04-13 NOTE — Progress Notes (Signed)
[facility] physician, Dr. Judd Lien) for closed left proximal left humerus and open distal left wrist fractures after tripping over a dog leash.   She has hx of PMR on chronic prednisone, HTN, IBS, and "cardiomyopathy" on Lasix 20 daily. She had nausea and diarrhea yesterday with slight worsening in her memory, improved over course of day, she was seen in ED last night and given Zofran and 500 cc NS.   Labs with stable Cr 1.2, mild leukocytosis, normal INR. Vitals stable. Hand surgeon planning to operate in am, requested medical admission d/t age and comorbidity.

## 2016-04-14 ENCOUNTER — Inpatient Hospital Stay (HOSPITAL_COMMUNITY): Payer: Medicare Other | Admitting: Anesthesiology

## 2016-04-14 ENCOUNTER — Encounter (HOSPITAL_COMMUNITY): Payer: Self-pay | Admitting: Anesthesiology

## 2016-04-14 ENCOUNTER — Encounter (HOSPITAL_COMMUNITY)
Admission: EM | Disposition: A | Discharge status: Home / Self Care | Payer: Self-pay | Source: Home / Self Care | Attending: Internal Medicine

## 2016-04-14 ENCOUNTER — Inpatient Hospital Stay (HOSPITAL_COMMUNITY): Payer: Medicare Other

## 2016-04-14 DIAGNOSIS — M80022A Age-related osteoporosis with current pathological fracture, left humerus, initial encounter for fracture: Secondary | ICD-10-CM

## 2016-04-14 DIAGNOSIS — Z9049 Acquired absence of other specified parts of digestive tract: Secondary | ICD-10-CM

## 2016-04-14 DIAGNOSIS — M1990 Unspecified osteoarthritis, unspecified site: Secondary | ICD-10-CM

## 2016-04-14 DIAGNOSIS — M353 Polymyalgia rheumatica: Secondary | ICD-10-CM

## 2016-04-14 DIAGNOSIS — Z90710 Acquired absence of both cervix and uterus: Secondary | ICD-10-CM

## 2016-04-14 DIAGNOSIS — M25512 Pain in left shoulder: Secondary | ICD-10-CM

## 2016-04-14 DIAGNOSIS — K580 Irritable bowel syndrome with diarrhea: Secondary | ICD-10-CM

## 2016-04-14 DIAGNOSIS — M80032A Age-related osteoporosis with current pathological fracture, left forearm, initial encounter for fracture: Secondary | ICD-10-CM

## 2016-04-14 DIAGNOSIS — S42302A Unspecified fracture of shaft of humerus, left arm, initial encounter for closed fracture: Secondary | ICD-10-CM

## 2016-04-14 DIAGNOSIS — Z880 Allergy status to penicillin: Secondary | ICD-10-CM

## 2016-04-14 DIAGNOSIS — W010XXA Fall on same level from slipping, tripping and stumbling without subsequent striking against object, initial encounter: Secondary | ICD-10-CM

## 2016-04-14 DIAGNOSIS — I5181 Takotsubo syndrome: Secondary | ICD-10-CM

## 2016-04-14 DIAGNOSIS — S62109A Fracture of unspecified carpal bone, unspecified wrist, initial encounter for closed fracture: Secondary | ICD-10-CM | POA: Diagnosis present

## 2016-04-14 DIAGNOSIS — Z91041 Radiographic dye allergy status: Secondary | ICD-10-CM

## 2016-04-14 DIAGNOSIS — R410 Disorientation, unspecified: Secondary | ICD-10-CM

## 2016-04-14 HISTORY — PX: OPEN REDUCTION INTERNAL FIXATION (ORIF) DISTAL RADIAL FRACTURE: SHX5989

## 2016-04-14 LAB — BASIC METABOLIC PANEL
Anion gap: 9 (ref 5–15)
BUN: 15 mg/dL (ref 6–20)
CHLORIDE: 104 mmol/L (ref 101–111)
CO2: 27 mmol/L (ref 22–32)
CREATININE: 1.22 mg/dL — AB (ref 0.44–1.00)
Calcium: 9.4 mg/dL (ref 8.9–10.3)
GFR calc non Af Amer: 36 mL/min — ABNORMAL LOW (ref >60)
GFR, EST AFRICAN AMERICAN: 42 mL/min — AB (ref >60)
GLUCOSE: 140 mg/dL — AB (ref 65–99)
POTASSIUM: 4.2 mmol/L (ref 3.5–5.1)
Sodium: 140 mmol/L (ref 135–145)

## 2016-04-14 LAB — URINE CULTURE: CULTURE: NO GROWTH

## 2016-04-14 LAB — SURGICAL PCR SCREEN
MRSA, PCR: NEGATIVE
Staphylococcus aureus: NEGATIVE

## 2016-04-14 LAB — CBC
HEMATOCRIT: 34.4 % — AB (ref 36.0–46.0)
Hemoglobin: 11 g/dL — ABNORMAL LOW (ref 12.0–15.0)
MCH: 31 pg (ref 26.0–34.0)
MCHC: 32 g/dL (ref 30.0–36.0)
MCV: 96.9 fL (ref 78.0–100.0)
PLATELETS: 153 10*3/uL (ref 150–400)
RBC: 3.55 MIL/uL — ABNORMAL LOW (ref 3.87–5.11)
RDW: 13.9 % (ref 11.5–15.5)
WBC: 11.5 10*3/uL — ABNORMAL HIGH (ref 4.0–10.5)

## 2016-04-14 LAB — GLUCOSE, CAPILLARY: GLUCOSE-CAPILLARY: 129 mg/dL — AB (ref 65–99)

## 2016-04-14 SURGERY — OPEN REDUCTION INTERNAL FIXATION (ORIF) DISTAL RADIUS FRACTURE
Anesthesia: Monitor Anesthesia Care | Site: Arm Lower | Laterality: Left

## 2016-04-14 MED ORDER — FENTANYL CITRATE (PF) 100 MCG/2ML IJ SOLN
INTRAMUSCULAR | Status: AC
  Filled 2016-04-14: qty 2

## 2016-04-14 MED ORDER — LISINOPRIL 5 MG PO TABS
5.0000 mg | ORAL_TABLET | Freq: Every day | ORAL | Status: DC
  Administered 2016-04-15 – 2016-04-21 (×6): 5 mg via ORAL
  Filled 2016-04-14 (×6): qty 1

## 2016-04-14 MED ORDER — CEFAZOLIN IN D5W 1 GM/50ML IV SOLN
INTRAVENOUS | Status: DC | PRN
  Administered 2016-04-14: 1 g via INTRAVENOUS

## 2016-04-14 MED ORDER — ONDANSETRON HCL 4 MG/2ML IJ SOLN
INTRAMUSCULAR | Status: AC
  Filled 2016-04-14: qty 2

## 2016-04-14 MED ORDER — MIDAZOLAM HCL 2 MG/2ML IJ SOLN
INTRAMUSCULAR | Status: AC
  Filled 2016-04-14: qty 2

## 2016-04-14 MED ORDER — FENTANYL CITRATE (PF) 250 MCG/5ML IJ SOLN
INTRAMUSCULAR | Status: AC
  Filled 2016-04-14: qty 5

## 2016-04-14 MED ORDER — METOPROLOL TARTRATE 5 MG/5ML IV SOLN
INTRAVENOUS | Status: DC | PRN
  Administered 2016-04-14: 2.5 mg via INTRAVENOUS

## 2016-04-14 MED ORDER — METOPROLOL TARTRATE 5 MG/5ML IV SOLN
INTRAVENOUS | Status: AC
  Filled 2016-04-14: qty 5

## 2016-04-14 MED ORDER — ADULT MULTIVITAMIN W/MINERALS CH
1.0000 | ORAL_TABLET | Freq: Every day | ORAL | Status: DC
  Administered 2016-04-15 – 2016-04-21 (×7): 1 via ORAL
  Filled 2016-04-14 (×7): qty 1

## 2016-04-14 MED ORDER — VANCOMYCIN HCL 500 MG IV SOLR
500.0000 mg | INTRAVENOUS | Status: DC
  Filled 2016-04-14: qty 500

## 2016-04-14 MED ORDER — EPHEDRINE 5 MG/ML INJ
INTRAVENOUS | Status: AC
  Filled 2016-04-14: qty 10

## 2016-04-14 MED ORDER — ONDANSETRON HCL 4 MG/2ML IJ SOLN
4.0000 mg | Freq: Once | INTRAMUSCULAR | Status: AC
  Administered 2016-04-14: 4 mg via INTRAVENOUS

## 2016-04-14 MED ORDER — PREDNISONE 5 MG PO TABS
5.0000 mg | ORAL_TABLET | Freq: Every day | ORAL | Status: DC
  Administered 2016-04-15 – 2016-04-21 (×7): 5 mg via ORAL
  Filled 2016-04-14 (×7): qty 1

## 2016-04-14 MED ORDER — ROPIVACAINE HCL 5 MG/ML IJ SOLN
INTRAMUSCULAR | Status: DC | PRN
  Administered 2016-04-14: 30 mL via PERINEURAL

## 2016-04-14 MED ORDER — ONDANSETRON HCL 4 MG/2ML IJ SOLN
INTRAMUSCULAR | Status: DC | PRN
  Administered 2016-04-14: 4 mg via INTRAVENOUS

## 2016-04-14 MED ORDER — SIMVASTATIN 40 MG PO TABS
40.0000 mg | ORAL_TABLET | Freq: Every day | ORAL | Status: DC
  Administered 2016-04-14: 40 mg via ORAL
  Filled 2016-04-14 (×4): qty 1

## 2016-04-14 MED ORDER — HYDROCODONE-ACETAMINOPHEN 5-325 MG PO TABS
1.0000 | ORAL_TABLET | Freq: Two times a day (BID) | ORAL | Status: DC | PRN
  Administered 2016-04-14 – 2016-04-17 (×7): 1 via ORAL
  Filled 2016-04-14 (×7): qty 1

## 2016-04-14 MED ORDER — METOPROLOL SUCCINATE ER 25 MG PO TB24
25.0000 mg | ORAL_TABLET | Freq: Every day | ORAL | Status: DC
  Administered 2016-04-15 – 2016-04-21 (×6): 25 mg via ORAL
  Filled 2016-04-14 (×6): qty 1

## 2016-04-14 MED ORDER — FUROSEMIDE 20 MG PO TABS
20.0000 mg | ORAL_TABLET | Freq: Every day | ORAL | Status: DC
  Administered 2016-04-16 – 2016-04-18 (×3): 20 mg via ORAL
  Filled 2016-04-14 (×4): qty 1

## 2016-04-14 MED ORDER — 0.9 % SODIUM CHLORIDE (POUR BTL) OPTIME
TOPICAL | Status: DC | PRN
  Administered 2016-04-14: 400 mL

## 2016-04-14 MED ORDER — HYDROMORPHONE HCL 1 MG/ML IJ SOLN
0.5000 mg | INTRAMUSCULAR | Status: DC | PRN
  Administered 2016-04-14: 0.5 mg via INTRAVENOUS
  Filled 2016-04-14: qty 1

## 2016-04-14 MED ORDER — LORATADINE 10 MG PO TABS
10.0000 mg | ORAL_TABLET | Freq: Every day | ORAL | Status: DC
  Administered 2016-04-15 – 2016-04-21 (×7): 10 mg via ORAL
  Filled 2016-04-14 (×7): qty 1

## 2016-04-14 MED ORDER — SODIUM CHLORIDE 0.9 % IV SOLN
INTRAVENOUS | Status: AC
  Administered 2016-04-14: 04:00:00 via INTRAVENOUS

## 2016-04-14 MED ORDER — PANTOPRAZOLE SODIUM 40 MG PO TBEC
40.0000 mg | DELAYED_RELEASE_TABLET | Freq: Every day | ORAL | Status: DC
  Administered 2016-04-15 – 2016-04-21 (×7): 40 mg via ORAL
  Filled 2016-04-14 (×7): qty 1

## 2016-04-14 MED ORDER — EPHEDRINE SULFATE 50 MG/ML IJ SOLN
INTRAMUSCULAR | Status: DC | PRN
  Administered 2016-04-14: 5 mg via INTRAVENOUS

## 2016-04-14 MED ORDER — PHENYLEPHRINE HCL 10 MG/ML IJ SOLN
INTRAVENOUS | Status: DC | PRN
  Administered 2016-04-14: 45 ug/min via INTRAVENOUS

## 2016-04-14 MED ORDER — BUPIVACAINE HCL (PF) 0.25 % IJ SOLN
INTRAMUSCULAR | Status: AC
Start: 2016-04-14 — End: 2016-04-14
  Filled 2016-04-14: qty 30

## 2016-04-14 MED ORDER — PROPOFOL 500 MG/50ML IV EMUL
INTRAVENOUS | Status: DC | PRN
  Administered 2016-04-14: 100 ug/kg/min via INTRAVENOUS

## 2016-04-14 MED ORDER — FENTANYL CITRATE (PF) 100 MCG/2ML IJ SOLN
INTRAMUSCULAR | Status: DC | PRN
  Administered 2016-04-14: 50 ug via INTRAVENOUS

## 2016-04-14 MED ORDER — LACTATED RINGERS IV SOLN
INTRAVENOUS | Status: DC | PRN
  Administered 2016-04-14: 10:00:00 via INTRAVENOUS

## 2016-04-14 MED ORDER — CEFAZOLIN IN D5W 1 GM/50ML IV SOLN
1.0000 g | Freq: Two times a day (BID) | INTRAVENOUS | Status: DC
  Administered 2016-04-14: 1 g via INTRAVENOUS
  Filled 2016-04-14 (×2): qty 50

## 2016-04-14 MED ORDER — PROPOFOL 10 MG/ML IV BOLUS
INTRAVENOUS | Status: DC | PRN
  Administered 2016-04-14: 30 mg via INTRAVENOUS

## 2016-04-14 MED ORDER — CEFAZOLIN SODIUM 1 G IJ SOLR
INTRAMUSCULAR | Status: AC
  Filled 2016-04-14: qty 10

## 2016-04-14 SURGICAL SUPPLY — 68 items
BANDAGE ACE 4X5 VEL STRL LF (GAUZE/BANDAGES/DRESSINGS) ×3 IMPLANT
BANDAGE ELASTIC 3 VELCRO ST LF (GAUZE/BANDAGES/DRESSINGS) ×3 IMPLANT
BIT DRILL 2.2 SS TIBIAL (BIT) ×3 IMPLANT
BLADE CLIPPER SURG (BLADE) IMPLANT
BNDG ESMARK 4X9 LF (GAUZE/BANDAGES/DRESSINGS) ×3 IMPLANT
BNDG GAUZE ELAST 4 BULKY (GAUZE/BANDAGES/DRESSINGS) ×3 IMPLANT
CANISTER SUCT 3000ML PPV (MISCELLANEOUS) ×3 IMPLANT
CORDS BIPOLAR (ELECTRODE) ×3 IMPLANT
COVER SURGICAL LIGHT HANDLE (MISCELLANEOUS) ×3 IMPLANT
CUFF TOURNIQUET SINGLE 18IN (TOURNIQUET CUFF) ×3 IMPLANT
CUFF TOURNIQUET SINGLE 24IN (TOURNIQUET CUFF) IMPLANT
DECANTER SPIKE VIAL GLASS SM (MISCELLANEOUS) ×3 IMPLANT
DRAPE OEC MINIVIEW 54X84 (DRAPES) ×3 IMPLANT
DRAPE SURG 17X11 SM STRL (DRAPES) ×3 IMPLANT
DRSG ADAPTIC 3X8 NADH LF (GAUZE/BANDAGES/DRESSINGS) ×3 IMPLANT
GAUZE SPONGE 4X4 12PLY STRL (GAUZE/BANDAGES/DRESSINGS) ×3 IMPLANT
GAUZE SPONGE 4X4 12PLY STRL LF (GAUZE/BANDAGES/DRESSINGS) ×6 IMPLANT
GAUZE SPONGE 4X4 16PLY XRAY LF (GAUZE/BANDAGES/DRESSINGS) ×3 IMPLANT
GAUZE XEROFORM 5X9 LF (GAUZE/BANDAGES/DRESSINGS) ×3 IMPLANT
GLOVE BIOGEL PI IND STRL 8.5 (GLOVE) ×1 IMPLANT
GLOVE BIOGEL PI INDICATOR 8.5 (GLOVE) ×2
GLOVE SURG ORTHO 8.0 STRL STRW (GLOVE) ×3 IMPLANT
GOWN STRL REUS W/ TWL LRG LVL3 (GOWN DISPOSABLE) ×1 IMPLANT
GOWN STRL REUS W/ TWL XL LVL3 (GOWN DISPOSABLE) ×1 IMPLANT
GOWN STRL REUS W/TWL LRG LVL3 (GOWN DISPOSABLE) ×2
GOWN STRL REUS W/TWL XL LVL3 (GOWN DISPOSABLE) ×2
K-WIRE 1.6 (WIRE) ×4
K-WIRE FX5X1.6XNS BN SS (WIRE) ×2
KIT BASIN OR (CUSTOM PROCEDURE TRAY) ×3 IMPLANT
KIT ROOM TURNOVER OR (KITS) ×3 IMPLANT
KWIRE FX5X1.6XNS BN SS (WIRE) ×2 IMPLANT
NEEDLE HYPO 25X1 1.5 SAFETY (NEEDLE) ×3 IMPLANT
NS IRRIG 1000ML POUR BTL (IV SOLUTION) ×3 IMPLANT
PACK ORTHO EXTREMITY (CUSTOM PROCEDURE TRAY) ×3 IMPLANT
PAD ARMBOARD 7.5X6 YLW CONV (MISCELLANEOUS) ×6 IMPLANT
PAD CAST 4YDX4 CTTN HI CHSV (CAST SUPPLIES) ×1 IMPLANT
PADDING CAST COTTON 4X4 STRL (CAST SUPPLIES) ×2
PEG LOCKING SMOOTH 2.2X20 (Screw) ×3 IMPLANT
PLATE NARROW DVR LEFT (Plate) ×3 IMPLANT
PUTTY DBM STAGRAFT PLUS 5CC (Putty) ×3 IMPLANT
SCREW LOCK 12X2.7X 3 LD (Screw) ×1 IMPLANT
SCREW LOCK 14X2.7X 3 LD TPR (Screw) ×2 IMPLANT
SCREW LOCK 16X2.7X 3 LD TPR (Screw) ×1 IMPLANT
SCREW LOCK 18X2.7X 3 LD TPR (Screw) ×1 IMPLANT
SCREW LOCK 20X2.7X 3 LD TPR (Screw) ×1 IMPLANT
SCREW LOCK 22X2.7X 3 LD TPR (Screw) ×1 IMPLANT
SCREW LOCKING 2.7X12MM (Screw) ×2 IMPLANT
SCREW LOCKING 2.7X13MM (Screw) ×6 IMPLANT
SCREW LOCKING 2.7X14 (Screw) ×4 IMPLANT
SCREW LOCKING 2.7X16 (Screw) ×2 IMPLANT
SCREW LOCKING 2.7X18 (Screw) ×2 IMPLANT
SCREW LOCKING 2.7X20MM (Screw) ×2 IMPLANT
SCREW LOCKING 2.7X22MM (Screw) ×2 IMPLANT
SCREW MULTI DIRECTIONAL 2.7X18 (Screw) ×6 IMPLANT
SCREW MULTI DIRECTIONAL 2.7X20 (Screw) ×3 IMPLANT
SOAP 2 % CHG 4 OZ (WOUND CARE) ×3 IMPLANT
SPLINT FIBERGLASS 3X35 (CAST SUPPLIES) ×3 IMPLANT
SPONGE LAP 4X18 X RAY DECT (DISPOSABLE) ×3 IMPLANT
SUT PROLENE 4 0 PS 2 18 (SUTURE) ×6 IMPLANT
SUT VIC AB 2-0 FS1 27 (SUTURE) ×3 IMPLANT
SUT VICRYL 4-0 PS2 18IN ABS (SUTURE) ×3 IMPLANT
SYR CONTROL 10ML LL (SYRINGE) IMPLANT
TOWEL OR 17X24 6PK STRL BLUE (TOWEL DISPOSABLE) ×3 IMPLANT
TOWEL OR 17X26 10 PK STRL BLUE (TOWEL DISPOSABLE) ×3 IMPLANT
TUBE CONNECTING 12'X1/4 (SUCTIONS) ×1
TUBE CONNECTING 12X1/4 (SUCTIONS) ×2 IMPLANT
WATER STERILE IRR 1000ML POUR (IV SOLUTION) ×3 IMPLANT
YANKAUER SUCT BULB TIP NO VENT (SUCTIONS) IMPLANT

## 2016-04-14 NOTE — Anesthesia Procedure Notes (Signed)
Anesthesia Regional Block: Supraclavicular block   Pre-Anesthetic Checklist: ,, timeout performed, Correct Patient, Correct Site, Correct Laterality, Correct Procedure, Correct Position, site marked, Risks and benefits discussed,  Surgical consent,  Pre-op evaluation,  At surgeon's request and post-op pain management  Laterality: Left  Prep: chloraprep       Needles:  Injection technique: Single-shot  Needle Type: Echogenic Stimulator Needle     Needle Length: 5cm  Needle Gauge: 22     Additional Needles:   Procedures: ultrasound guided, nerve stimulator,,,,,,   Nerve Stimulator or Paresthesia:  Response: bicep contraction, 0.45 mA,   Additional Responses:   Narrative:  Start time: 04/14/2016 9:56 AM End time: 04/14/2016 10:04 AM Injection made incrementally with aspirations every 5 mL.  Performed by: Personally  Anesthesiologist: Heather Roberts  Additional Notes: Functioning IV was confirmed and monitors applied.  A 50mm 22ga echogenic arrow stimulator was used. Sterile prep and drape,hand hygiene and sterile gloves were used.Ultrasound guidance: relevant anatomy identified, needle position confirmed, local anesthetic spread visualized around nerve(s)., vascular puncture avoided.  Image printed for medical record.  Negative aspiration and negative test dose prior to incremental administration of local anesthetic. The patient tolerated the procedure well.

## 2016-04-14 NOTE — Progress Notes (Signed)
Patient keeps unwrapping her dressing on her left arm and taking off her scd's.  Dressing was placed back on and scd's left off since it may be more cause for irritation.

## 2016-04-14 NOTE — Progress Notes (Signed)
Pharmacy Antibiotic Note  MODELL FENDRICK is a 83 y.o. female admitted on 04/13/2016 with open fracture.  Pharmacy has been consulted for vancomycin dosing.  Going to OR today. Afebrile, WBC 11.5. SCr slightly elevatd at 1.22, CrCl ~47ml/min.  Plan: Start vancomycin  IV Q48h Monitor clinical picture, renal function, VT prn F/U LOT     Temp (24hrs), Avg:98.4 F (36.9 C), Min:98.2 F (36.8 C), Max:98.5 F (36.9 C)   Recent Labs Lab 04/12/16 1918 04/13/16 2226 04/14/16 0521  WBC 7.7 12.7* 11.5*  CREATININE 1.22* 1.22* 1.22*    Estimated Creatinine Clearance: 18.4 mL/min (A) (by C-G formula based on SCr of 1.22 mg/dL (H)).    Allergies  Allergen Reactions  . Iohexol     rash  . Penicillins     REACTION: rash    Thank you for allowing pharmacy to be a part of this patient's care.  Enzo Bi, PharmD, BCPS Clinical Pharmacist Pager [phone] 04/14/2016 10:29 AM

## 2016-04-14 NOTE — Op Note (Signed)
PREOPERATIVE DIAGNOSIS: 1. Comminuted left wrist intra-articular distal radius fracture of 3 or more fragments, grade 2 open distal radius fracture  #2. Osteoporosis  #3 left proximal humerus 3 part fracture  POSTOPERATIVE DIAGNOSIS: Same  ATTENDING SURGEON: Dr. [name] he was scrubbed and present for the entire procedure  ASSISTANT SURGEON: None  ANESTHESIA: Supraclavicular block block with IV sedation  OPERATIVE PROCEDURE: 1. Open treatment of left wrist intra-articular distal radius fracture 3 or more fragments.  #2 left wrist brachial radialis tendon release, forearm flexor release.  #3 radiographs 3 views left wrist.  #4 open treatment of left wrist injury intra-articular open distal radius fracture with debridement of skin subcutaneous tissue and bone, excisional debridement.   #5 traumatic wound laceration 7-1/2 cm repair, complex wound closure  #6 close manipulation of left proximal humerus fracture requiring manipulation and anesthesia  IMPLANTS: Biomet DVR cross lock narrow plate with several cc of Biomet stay graft bone graft substitute  RADIOGRAPHIC INTERPRETATION: AP lateral oblique views of the wrist do show the volar plate fixation in place with good restoration of the length and alignment with a comminuted distal ulna fracture and severe marked osteopenia.  SURGICAL INDICATIONS: [name] is a 83 year old right-hand-dominant female with long-standing osteoporosis and osteoarthritis who sustained an injury to her left upper extremity. Patient was noted to have the open distal radius fracture as well as left proximal humerus fracture. Patient is recommended undergo the above procedure. Risks benefits and alternatives discussed in detail the patient has signed informed consent was obtained.   SURGICAL TECHNIQUE:  Patient probably identified in the preoperative holding area and marked with a permanent marker make on left wrist. Patient brought back Room placed  supine on the incision table the IV sedation was administered. Patient previously undergone the supraclavicular block. A well-padded tourniquet was then placed on the left brachium [place] 1000 drape. The left upper extremity and prepped and draped in normal sterile fashion. A timeout was called cracks that was done via the procedure was then begun. Attention was then turned to the left wrist. Excisional debridement of the skin is obtaining his tissue was then done of the dorsal wound with knife and sharp scissors and curettes. Debridement of the open fracture was then done dorsally. The wound was then thoroughly irrigated after thorough irrigation the skin was then complex wound closure was then done dorsally of the 7 1/2 cm wound with Prolene sutures.  Attention was then turned the volar aspect of the wrist. A longitudinal incision was made directly over the FCR sheath after the tourniquet was then insufflated. Deep dissection carried down through the FCR to expose the FPL and the pronator quadratus was then swept out of the way in an L-shaped fashion. Fracture site was exposed. The patient had a highly comminuted intra-articular distal radius fracture 3 or more fragments up. The patient had very very poor bone quality. Attention was then turned to reduction. Longitudinal traction was then applied and the volar plate was then applied and held distally and proximally with K wires plate position was then confirmed using mini C-arm and felt to be the plate in the distal radius was to be at adequate length. The wound was irrigated. The defect in the metaphysis was packec with a stay graft bone graft substitute and the plate was then fixed proximally distally with a combination of locking screws and multidirectional screws distally. The wound was shaft fixation was then carried out proximally with locking and nonlocking screws. The wound was  irrigated. Final radiographs were then obtained.  After plate fixation the  pronator quadratus was then repaired with 2-0 Vicryl subcutaneous tissues closed with Monocryl and the skin closed with a running 4-0 Prolene. Xeroform dressings were then applied. Sterile compressive bandage applied. The patient was then placed in a well-padded sugar tong splint taken recovery room in good condition. Prior to leaving the operating room gentle manipulation and try longitudinal traction was then done of the proximal humerus and. The patient was then placed in a sling. She tolerated these procedures well.  POSTOPERATIVE PLAN: Patient admitted back to the internal medicine service. He'll be nonweightbearing in the left upper extremity. We will continue with the long-arm splint for a total of 2 weeks. IV antibiotics for the next 48 hours. Follow-up in my office in approximately 2 weeks for wound check. Radiographs of the wrist at the first visit and then application of a short arm cast. Short arm cast immobilization for an additional 3 weeks. We'll then begin therapy regimen at that time. We will ask for consultation one of my partners for the treatment of the shoulder. We will try to treat this in a nonsurgical fashion. Continue with the sling until incision is made about further intervention for the shoulder. Please contact me directly if there is any questions. Ice elevation and nonweightbearing left upper extremity.

## 2016-04-14 NOTE — Progress Notes (Signed)
Patient arrived from [facility] via [facility].  She is alert and pleasant.  Complaining of pain with activity.  Patient very forgetful, needs constant reminding of why she is here.

## 2016-04-14 NOTE — Progress Notes (Addendum)
Pt seen and examined at bedside, pt admitted after midnight, please see earlier admission note by Dr. Criselda Peaches. Pt admitted for further evaluation and management for new left distal radius and proximal humerus fracture that she has sustained after an episode of fall (fell on an outstretched left upper extremity). Pt reports no concerns this AM. Plan to take to OR this AM. Ortho team assistance is appreciated. VSS, blood work reviewed, notable for stable Cr at 1.2, WBC trending down from 12.7 --> 11.5. Otherwise stable. Repeat CBC and BMP in AM. Due to open fracture, will start IV ABX for now. Vanc per pharmacy ordered.   Debbora Presto, MD  [facility] Pager [phone]  If 7PM-7AM, please contact night-coverage [email or web address] Password [email or web address]

## 2016-04-14 NOTE — Anesthesia Procedure Notes (Signed)
Procedure Name: MAC Date/Time: 04/14/2016 10:19 AM Performed by: Jacquiline Doe A Pre-anesthesia Checklist: Patient identified, Emergency Drugs available, Suction available and Patient being monitored Patient Re-evaluated:Patient Re-evaluated prior to inductionOxygen Delivery Method: Simple face mask and Nasal cannula Intubation Type: IV induction Airway Equipment and Method: Oral airway Placement Confirmation: positive ETCO2 Dental Injury: Teeth and Oropharynx as per pre-operative assessment

## 2016-04-14 NOTE — Anesthesia Postprocedure Evaluation (Addendum)
Anesthesia Post Note  Patient: [name]  Procedure(s) Performed: Procedure(s) (LRB): OPEN REDUCTION INTERNAL FIXATION (ORIF) DISTAL RADIAL FRACTURE (Left)  Patient location during evaluation: PACU Anesthesia Type: Regional Level of consciousness: awake and alert Pain management: pain level controlled Vital Signs Assessment: post-procedure vital signs reviewed and stable Respiratory status: spontaneous breathing and respiratory function stable Cardiovascular status: stable Anesthetic complications: no       Last Vitals:  Vitals:   04/14/16 1153 04/14/16 1200  BP: 127/82   Pulse: 66 65  Resp: 12 16  Temp: 36.5 C     Last Pain:  Vitals:   04/14/16 0302  TempSrc: Oral  PainSc:                  [name]

## 2016-04-14 NOTE — Anesthesia Preprocedure Evaluation (Addendum)
Anesthesia Evaluation  Patient identified by MRN, date of birth, ID band Patient awake    Reviewed: Allergy & Precautions, NPO status , Patient's Chart, lab work & pertinent test results  Airway Mallampati: II  TM Distance: >3 FB Neck ROM: Full    Dental no notable dental hx. (+) Dental Advisory Given   Pulmonary neg pulmonary ROS,    Pulmonary exam normal        Cardiovascular hypertension, negative cardio ROS Normal cardiovascular exam     Neuro/Psych negative neurological ROS  negative psych ROS   GI/Hepatic negative GI ROS, Neg liver ROS,   Endo/Other  negative endocrine ROS  Renal/GU Renal InsufficiencyRenal disease  negative genitourinary   Musculoskeletal  (+) Arthritis , Fibromyalgia -  Abdominal   Peds negative pediatric ROS (+)  Hematology negative hematology ROS (+)   Anesthesia Other Findings   Reproductive/Obstetrics negative OB ROS                            Anesthesia Physical Anesthesia Plan  ASA: III  Anesthesia Plan: MAC and Regional   Post-op Pain Management:    Induction:   Airway Management Planned: Natural Airway and Simple Face Mask  Additional Equipment:   Intra-op Plan:   Post-operative Plan:   Informed Consent: I have reviewed the patients History and Physical, chart, labs and discussed the procedure including the risks, benefits and alternatives for the proposed anesthesia with the patient or authorized representative who has indicated his/her understanding and acceptance.   Consent reviewed with POA  Plan Discussed with: CRNA, Anesthesiologist and Surgeon  Anesthesia Plan Comments:        Anesthesia Quick Evaluation

## 2016-04-14 NOTE — H&P (Signed)
History and Physical    [name] GNF:[id number] DOB: [birth year] DOA: 04/13/2016  PCP: Pcp Not In System - NP Manson Passey in Care Everywhere, though patient cannot confirm this Patient coming from: Home  Chief Complaint: fall, wrist pain  HPI: [name] is a 83 y.o. female with medical history significant of takosubo cardiomyopathy with EF of 50%, Osteoporosis, osteoarthritis, PMR on chronic steroids, HTN who presents after a fall associated with wrist and humerus fractures on the left.  She had a fall this afternoon, where she tripped over a dog leash and landed on gravel.  She has associated left shoulder pain.   She was splinted and sent to [facility] for surgical evaluation.  The patient reports some pain, mostly in her shoulder.  She is moving her right wrist well, has echymosis on the right dorsal hand/wrist.  She is somewhat confused, but this is not unexpected in a 83yo at 3am.  She reports some intermittent diarrhea.  She was seen in the ED on 3/30 for nausea, diarrhea and dehydration; she was given fluids and xanax and was stable for discharge home.  She lives with her daughter who brought her to the ED.  She has a history of Takosubo's Cardiomyopathy and takes lasix.  She has no history of coronary disease, MI, chest pain.  She has no history of pulmonary disease.  She has a "cardiac arrhythmia" noted in her history, but she was not able to give me more details about this. No family in room.   ED Course: In the ED, she was noted to have fractures in the left wrist and humerus.  She was splinted and films were reviewed by Hand Surgery who recommended that she be evaluated for preop recommendations prior to surgery.    Review of Systems: As per HPI otherwise 10 point review of systems negative.    Past Medical History:  Diagnosis Date  . Cardiac arrhythmia   . Cholelithiasis   . DJD (degenerative joint disease)   . Esophageal stricture   . Fibromyalgia   . Hypertension   . Kidney  disease   . Osteoarthritis   . Osteoporosis   . Renal calculi   . Sinusitis   . Takotsubo cardiomyopathy 2009    Past Surgical History:  Procedure Laterality Date  . ABDOMINAL HYSTERECTOMY    . CHOLECYSTECTOMY    . EYE SURGERY     Confirmed with patient.   reports that she has never smoked. She has never used smokeless tobacco. She reports that she does not drink alcohol or use drugs.  Allergies  Allergen Reactions  . Iohexol     rash  . Penicillins     REACTION: rash   She reports that her parents were healthy and died natural deaths. Family History  Problem Relation Age of Onset  . Colon cancer Neg Hx      Prior to Admission medications   Medication Sig Start Date End Date Taking? Authorizing Provider  Calcium 1200-1000 MG-UNIT CHEW Chew by mouth. 1 chew daily (doesn't take everyday)     Historical Provider, MD  furosemide (LASIX) 20 MG tablet Take 20 mg by mouth at bedtime.      Historical Provider, MD  HYDROcodone-acetaminophen (NORCO) 5-325 MG per tablet Take 1 tablet by mouth 2 (two) times daily as needed.      Historical Provider, MD  lisinopril (PRINIVIL,ZESTRIL) 5 MG tablet Take 5 mg by mouth daily.      Historical Provider, MD  loperamide (  IMODIUM) 2 MG capsule Take 2 mg by mouth as needed.      Historical Provider, MD  loratadine (CLARITIN) 10 MG tablet Take 10 mg by mouth daily.      Historical Provider, MD  metoprolol succinate (TOPROL-XL) 25 MG 24 hr tablet Take 25 mg by mouth daily.      Historical Provider, MD  Multiple Vitamins-Minerals (EYE-VITES PO) Take by mouth daily. Take 1 tablet by mouth daily     Historical Provider, MD  ondansetron (ZOFRAN) 4 MG tablet Take 4 mg by mouth as needed.      Historical Provider, MD  pantoprazole (PROTONIX) 40 MG tablet Take 40 mg by mouth daily.      Historical Provider, MD  predniSONE (DELTASONE) 5 MG tablet Take 5 mg by mouth daily.      Historical Provider, MD  simvastatin (ZOCOR) 40 MG tablet Take 40 mg by mouth  daily.      Historical Provider, MD    Physical Exam: Vitals:   04/14/16 0030 04/14/16 0116 04/14/16 0148 04/14/16 0302  BP: 138/68 140/64 (!) 141/61 128/68  Pulse: 73 66 70 74  Resp: Temp:  98.5 F (36.9 C) 98.2 F (36.8 C) 98.5 F (36.9 C)  TempSrc:  Oral Oral Oral  SpO2: 94% 90% 92% 92%    Constitutional: NAD, calm, comfortable Vitals:   04/14/16 0030 04/14/16 0116 04/14/16 0148 04/14/16 0302  BP: 138/68 140/64 (!) 141/61 128/68  Pulse: 73 66 70 74  Resp: Temp:  98.5 F (36.9 C) 98.2 F (36.8 C) 98.5 F (36.9 C)  TempSrc:  Oral Oral Oral  SpO2: 94% 90% 92% 92%   Eyes: anicteric sclerae, lids and conjunctivae normal Neck: normal, supple Respiratory: CTAB, no wheezing, no crackles. Normal respiratory effort.  Cardiovascular: Regular rate and normal rhythm, no murmurs / rubs / gallops. No extremity edema. 2+ pedal pulses. Pulse intact in right upper extremity radial pulse.  Abdomen: NT, ND, +BS Musculoskeletal: no clubbing / cyanosis.  Normal muscle tone for age. Left arm in splint and bandages Skin: Multiple ecchymoses on arms and legs, wounds on bilateral knees covered with bandage.  Left arm/wrist in splint and bandage.   Neurologic: She was moving all extremities and followed all commands, alert.  She was not oriented to place or time.  Psychiatric: Impaired insight, she was not aware she had broken her wrist.    Labs on Admission: I have personally reviewed following labs and imaging studies  CBC:  Recent Labs Lab 04/12/16 1918 04/13/16 2226  WBC 7.7 12.7*  NEUTROABS 5.4 10.1*  HGB 13.3 12.0  HCT 40.6 37.6  MCV 96.0 97.7  PLT 176 157   Basic Metabolic Panel:  Recent Labs Lab 04/12/16 1918 04/13/16 2226  NA 141 140  K 3.7 4.4  CL 105 107  CO2 28 27  GLUCOSE 117* 125*  BUN 20 19  CREATININE 1.22* 1.22*  CALCIUM 9.2 9.0   GFR: Estimated Creatinine Clearance: 18.4 mL/min (A) (by C-G formula based on SCr of 1.22 mg/dL  (H)). Liver Function Tests: No results for input(s): AST, ALT, ALKPHOS, BILITOT, PROT, ALBUMIN in the last 168 hours. No results for input(s): LIPASE, AMYLASE in the last 168 hours. No results for input(s): AMMONIA in the last 168 hours. Coagulation Profile:  Recent Labs Lab 04/13/16 2226  INR 0.94   Cardiac Enzymes: No results for input(s): CKTOTAL, CKMB, CKMBINDEX, TROPONINI in the last 168 hours. BNP (  last 3 results) No results for input(s): PROBNP in the last 8760 hours. HbA1C: No results for input(s): HGBA1C in the last 72 hours. CBG:  Recent Labs Lab 04/12/16 1900  GLUCAP 113*   Lipid Profile: No results for input(s): CHOL, HDL, LDLCALC, TRIG, CHOLHDL, LDLDIRECT in the last 72 hours. Thyroid Function Tests: No results for input(s): TSH, T4TOTAL, FREET4, T3FREE, THYROIDAB in the last 72 hours. Anemia Panel: No results for input(s): VITAMINB12, FOLATE, FERRITIN, TIBC, IRON, RETICCTPCT in the last 72 hours. Urine analysis:    Component Value Date/Time   COLORURINE YELLOW 04/12/2016 1857   APPEARANCEUR CLEAR 04/12/2016 1857   LABSPEC 1.006 04/12/2016 1857   PHURINE 5.5 04/12/2016 1857   GLUCOSEU NEGATIVE 04/12/2016 1857   HGBUR NEGATIVE 04/12/2016 1857   BILIRUBINUR NEGATIVE 04/12/2016 1857   KETONESUR NEGATIVE 04/12/2016 1857   PROTEINUR NEGATIVE 04/12/2016 1857   UROBILINOGEN 1.0 03/01/2010 1654   NITRITE NEGATIVE 04/12/2016 1857   LEUKOCYTESUR NEGATIVE 04/12/2016 1857    Radiological Exams on Admission: Dg Wrist Complete Left  Result Date: 04/13/2016 CLINICAL DATA:  Trip and fall.  LEFT forearm bleeding and deformity. EXAM: LEFT WRIST - COMPLETE 3+ VIEW COMPARISON:  None. FINDINGS: Acute comminuted distal radial fracture with intra-articular extension and impaction. Mild volar angulation of distal bony fragments. Acute distal ulnar fracture with moderate volar angulation of distal bony fragments, no intra-articular extension. No dislocation. No destructive bony  lesions. Osteopenia. Severe first carpometacarpal osteoarthrosis with joint space narrowing, periarticular sclerosis and marginal spurring. Thick Bandage overlies the dorsal wrist. Minimal subcutaneous gas within the medial wrist, no definite radiopaque foreign bodies. IMPRESSION: Acute displaced distal radius and ulnar open fractures. No dislocation. Electronically Signed   By: Awilda Metro M.D.   On: 04/13/2016 22:25   Ct Head Wo Contrast  Result Date: 04/12/2016 CLINICAL DATA:  83 year old with altered mental status. Headache today. EXAM: CT HEAD WITHOUT CONTRAST TECHNIQUE: Contiguous axial images were obtained from the base of the skull through the vertex without intravenous contrast. COMPARISON:  None. FINDINGS: Brain: Generalized cerebral atrophy. Mild for age chronic small vessel ischemia. Remote lacunar infarct in the left basal ganglia versus prominent perivascular space. No hemorrhage. No CT findings of acute ischemia. No subdural or extra-axial collection. No hydrocephalus, mass effect or midline shift. Vascular: Atherosclerosis of skullbase vasculature without hyperdense vessel or abnormal calcification. Skull: Normal. Negative for fracture or focal lesion. Sinuses/Orbits: Mucous retention cyst in the sphenoid sinus with mild mucosal thickening. Mild mucosal thickening of right sphenoid sinus. Mastoid air cells are well-aerated. Visualized orbits are unremarkable. Other: None. IMPRESSION: 1.  No acute intracranial abnormality. 2. Generalized atrophy and mild chronic small vessel ischemia. Remote lacunar infarct in the left basal ganglia. Electronically Signed   By: Rubye Oaks M.D.   On: 04/12/2016 21:30   Dg Chest Port 1 View  Result Date: 04/14/2016 CLINICAL DATA:  Preoperative chest radiograph. Status post fall. Initial encounter. EXAM: PORTABLE CHEST 1 VIEW COMPARISON:  Chest radiograph and CT of the chest performed 10/14/2010 FINDINGS: The lungs are well-aerated. Minimal bibasilar  atelectasis is noted. There is no evidence of pleural effusion or pneumothorax. The cardiomediastinal silhouette is mildly enlarged. A large hiatal hernia is noted, containing fluid and air. There is a displaced fracture at the surgical neck of the left humerus. IMPRESSION: 1. Minimal bibasilar atelectasis noted.  Lungs otherwise clear. 2. Displaced fracture at the surgical neck of the left humerus. 3. Large hiatal hernia, containing fluid and air. 4. Mild cardiomegaly. Electronically Signed  By: Roanna Raider M.D.   On: 04/14/2016 00:46   Dg Shoulder Left  Result Date: 04/13/2016 CLINICAL DATA:  Fall with left shoulder pain EXAM: LEFT SHOULDER - 2+ VIEW COMPARISON:  None. FINDINGS: There is a comminuted left proximal humerus fracture involving the surgical neck and greater tuberosity, with significant impaction and approximately 1.6 cm superolateral displacement of the dominant distal fracture fragment. Left acromioclavicular joint appears intact. No dislocation at the left glenohumeral joint. No suspicious focal osseous lesions. No radiopaque foreign body. Aortic atherosclerosis. IMPRESSION: Comminuted impacted displaced left proximal humerus fracture involving the surgical neck and greater tuberosity. Electronically Signed   By: Delbert Phenix M.D.   On: 04/13/2016 22:24   Dg Hand Complete Right  Result Date: 04/13/2016 CLINICAL DATA:  Fall.  Right hand pain. EXAM: RIGHT HAND - COMPLETE 3+ VIEW COMPARISON:  None. FINDINGS: No fracture or dislocation. Severe polyarticular erosive osteoarthritis throughout the interphalangeal joints of the first through fifth fingers. Severe osteoarthritis at the first carpometacarpal joint. Moderate osteoarthritis at the first through third metacarpophalangeal joints. No suspicious focal osseous lesions. No radiopaque foreign bodies. IMPRESSION: No fracture or dislocation in the right hand. 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 Electronically  Signed   By: Delbert Phenix M.D.   On: 04/13/2016 22:26    EKG: Independently reviewed. From 3/30, likely with RBBB, atypical  Assessment/Plan  Open fracture of left wrist/osteoporosis - related to fall, ? Fragility fracture - Ortho/hand has evaluated films and plans for surgery possibly today - Using Chales Abrahams Risk for perioperative MI or cardiac arrest - risk was at 0.54% assuming an ASA II status (I can not tell by review of multiple PCP notes that she has severely uncontrolled diseases; BP was well controlled in the ED when adequate pain control provided).  She has no history of lung disease.  CXR done today shows bibasilar atelectasis.  This would put her at low risk for a perioperative cardiac issue.  - She is on chronic steroids, which place her at risk for adrenal insufficiency perioperatively.  She is in a gray area; as she has been taking prednisone 5mg  daily at least since 2015.  Depending on time course of surgery, she could benefit from stress steroids prior to and after surgery.  If time course of surgery is expected to be short, she may not benefit from the extra doses.  If deemed appropriate, dose would be 50mg  hydrocortisone prior to procedure and 25mg  q 8 hours after procedure for 24 hours.  This is in addition to her morning prednisone dose.  Based on Ortho notes, no time for surgery yet planned so I did not order this tonight. - If surgery is to be post-poned, could check an AM cortisol to further direct the plan.  - Pain control with vicoden and hydromorphone  HTN - BP well controlled with adequate pain control - Continue metoprolol and lisinopril and lasix  PMR on chronic steroids - See above, continue home dose of prednisone and possibly stress dose hydrocortisone if surgery planned for tomorrow  Osteoarthritis - Pain control as abive  IBS/diarrhea - She has intermittent diarrhea, was recently dehydrated - IVF with NS at 50cc/hr for 8 hours  Takotsubo's Cardiomyopathy -  Mild cardiomyopathy on CXR, TTE reviewed - Continue lasix    DVT prophylaxis: SCDs Code Status: Full Family Communication: Attempted to call Daughter Malachi Bonds for specifics about code status/wishes as patient was somewhat confused this evening, no response to call.  Disposition Plan: Admit  for surgery, likely discharge in 2-3 days  Consults called: Paulita Cradle Admission status: Inpatient, Dwana Melena MD [facility] Pager [phone]  If 7PM-7AM, please contact night-coverage [email or web address] Password [email or web address]  04/14/2016, 4:25 AM

## 2016-04-14 NOTE — Consult Note (Signed)
Reason for Consult: Left distal radius and proximal humerus fracture Referring Physician: Dr. Gilles Chiquito internal medicine  [name] is an 83 y.o. female.  HPI: [name] is a right-hand dominant female with a past medical history significant for cardiomyopathy, osteoporosis, osteoarthritis, PMR on chronic steroids hypertension who sustained a fall on an outstretched left upper extremity sustaining significant injuries to her left wrist and left proximal humerus. She was seen at the [place] [facility] and referred to [facility] for definitive treatment. No prior injury to her left wrist. She reports pain in her left shoulder and her left wrist.  She was admitted to the internal medicine service for evaluation prior to surgery.  Past Medical History:  Diagnosis Date  . Cardiac arrhythmia   . Cholelithiasis   . DJD (degenerative joint disease)   . Esophageal stricture   . Fibromyalgia   . Hypertension   . Kidney disease   . Osteoarthritis   . Osteoporosis   . Renal calculi   . Sinusitis   . Takotsubo cardiomyopathy 2009    Past Surgical History:  Procedure Laterality Date  . ABDOMINAL HYSTERECTOMY    . CHOLECYSTECTOMY    . EYE SURGERY      Family History  Problem Relation Age of Onset  . Colon cancer Neg Hx     Social History:  reports that she has never smoked. She has never used smokeless tobacco. She reports that she does not drink alcohol or use drugs.  Allergies:  Allergies  Allergen Reactions  . Iohexol     rash  . Penicillins     REACTION: rash    Medications: I have reviewed the patient's current medications.  Results for orders placed or performed during the hospital encounter of 04/13/16 (from the past 48 hour(s))  Basic metabolic panel     Status: Abnormal   Collection Time: 04/13/16 10:26 PM  Result Value Ref Range   Sodium 140 135 - 145 mmol/L   Potassium 4.4 3.5 - 5.1 mmol/L   Chloride 107 101 - 111 mmol/L   CO2 27 22 - 32 mmol/L    Glucose, Bld 125 (H) 65 - 99 mg/dL   BUN 19 6 - 20 mg/dL   Creatinine, Ser 1.22 (H) 0.44 - 1.00 mg/dL   Calcium 9.0 8.9 - 10.3 mg/dL   GFR calc non Af Amer 36 (L) >60 mL/min   GFR calc Af Amer 42 (L) >60 mL/min    Comment: (NOTE) The eGFR has been calculated using the CKD EPI equation. This calculation has not been validated in all clinical situations. eGFR's persistently <60 mL/min signify possible Chronic Kidney Disease.    Anion gap 6 5 - 15  CBC with Differential     Status: Abnormal   Collection Time: 04/13/16 10:26 PM  Result Value Ref Range   WBC 12.7 (H) 4.0 - 10.5 K/uL   RBC 3.85 (L) 3.87 - 5.11 MIL/uL   Hemoglobin 12.0 12.0 - 15.0 g/dL   HCT 37.6 36.0 - 46.0 %   MCV 97.7 78.0 - 100.0 fL   MCH 31.2 26.0 - 34.0 pg   MCHC 31.9 30.0 - 36.0 g/dL   RDW 13.5 11.5 - 15.5 %   Platelets 157 150 - 400 K/uL   Neutrophils Relative % 80 %   Neutro Abs 10.1 (H) 1.7 - 7.7 K/uL   Lymphocytes Relative 10 %   Lymphs Abs 1.3 0.7 - 4.0 K/uL   Monocytes Relative 10 %  Monocytes Absolute 1.2 (H) 0.1 - 1.0 K/uL   Eosinophils Relative 0 %   Eosinophils Absolute 0.0 0.0 - 0.7 K/uL   Basophils Relative 0 %   Basophils Absolute 0.0 0.0 - 0.1 K/uL  Protime-INR     Status: None   Collection Time: 04/13/16 10:26 PM  Result Value Ref Range   Prothrombin Time 12.6 11.4 - 15.2 seconds   INR 0.94   Surgical pcr screen     Status: None   Collection Time: 04/14/16  4:31 AM  Result Value Ref Range   MRSA, PCR NEGATIVE NEGATIVE   Staphylococcus aureus NEGATIVE NEGATIVE    Comment:        The Xpert SA Assay (FDA approved for NASAL specimens in patients over 55 years of age), is one component of a comprehensive surveillance program.  Test performance has been validated by [facility] for patients greater than or equal to 26 year old. It is not intended to diagnose infection nor to guide or monitor treatment.   Basic metabolic panel     Status: Abnormal   Collection Time: 04/14/16  5:21  AM  Result Value Ref Range   Sodium 140 135 - 145 mmol/L   Potassium 4.2 3.5 - 5.1 mmol/L   Chloride 104 101 - 111 mmol/L   CO2 27 22 - 32 mmol/L   Glucose, Bld 140 (H) 65 - 99 mg/dL   BUN 15 6 - 20 mg/dL   Creatinine, Ser 1.22 (H) 0.44 - 1.00 mg/dL   Calcium 9.4 8.9 - 10.3 mg/dL   GFR calc non Af Amer 36 (L) >60 mL/min   GFR calc Af Amer 42 (L) >60 mL/min    Comment: (NOTE) The eGFR has been calculated using the CKD EPI equation. This calculation has not been validated in all clinical situations. eGFR's persistently <60 mL/min signify possible Chronic Kidney Disease.    Anion gap 9 5 - 15  CBC     Status: Abnormal   Collection Time: 04/14/16  5:21 AM  Result Value Ref Range   WBC 11.5 (H) 4.0 - 10.5 K/uL   RBC 3.55 (L) 3.87 - 5.11 MIL/uL   Hemoglobin 11.0 (L) 12.0 - 15.0 g/dL   HCT 34.4 (L) 36.0 - 46.0 %   MCV 96.9 78.0 - 100.0 fL   MCH 31.0 26.0 - 34.0 pg   MCHC 32.0 30.0 - 36.0 g/dL   RDW 13.9 11.5 - 15.5 %   Platelets 153 150 - 400 K/uL  Glucose, capillary     Status: Abnormal   Collection Time: 04/14/16  6:08 AM  Result Value Ref Range   Glucose-Capillary 129 (H) 65 - 99 mg/dL    Dg Wrist Complete Left  Result Date: 04/13/2016 CLINICAL DATA:  Trip and fall.  LEFT forearm bleeding and deformity. EXAM: LEFT WRIST - COMPLETE 3+ VIEW COMPARISON:  None. FINDINGS: Acute comminuted distal radial fracture with intra-articular extension and impaction. Mild volar angulation of distal bony fragments. Acute distal ulnar fracture with moderate volar angulation of distal bony fragments, no intra-articular extension. No dislocation. No destructive bony lesions. Osteopenia. Severe first carpometacarpal osteoarthrosis with joint space narrowing, periarticular sclerosis and marginal spurring. Thick Bandage overlies the dorsal wrist. Minimal subcutaneous gas within the medial wrist, no definite radiopaque foreign bodies. IMPRESSION: Acute displaced distal radius and ulnar open fractures. No  dislocation. Electronically Signed   By: Elon Alas M.D.   On: 04/13/2016 22:25   Ct Head Wo Contrast  Result Date: 04/12/2016 CLINICAL  DATA:  83 year old with altered mental status. Headache today. EXAM: CT HEAD WITHOUT CONTRAST TECHNIQUE: Contiguous axial images were obtained from the base of the skull through the vertex without intravenous contrast. COMPARISON:  None. FINDINGS: Brain: Generalized cerebral atrophy. Mild for age chronic small vessel ischemia. Remote lacunar infarct in the left basal ganglia versus prominent perivascular space. No hemorrhage. No CT findings of acute ischemia. No subdural or extra-axial collection. No hydrocephalus, mass effect or midline shift. Vascular: Atherosclerosis of skullbase vasculature without hyperdense vessel or abnormal calcification. Skull: Normal. Negative for fracture or focal lesion. Sinuses/Orbits: Mucous retention cyst in the sphenoid sinus with mild mucosal thickening. Mild mucosal thickening of right sphenoid sinus. Mastoid air cells are well-aerated. Visualized orbits are unremarkable. Other: None. IMPRESSION: 1.  No acute intracranial abnormality. 2. Generalized atrophy and mild chronic small vessel ischemia. Remote lacunar infarct in the left basal ganglia. Electronically Signed   By: Jeb Levering M.D.   On: 04/12/2016 21:30   Dg Chest Port 1 View  Result Date: 04/14/2016 CLINICAL DATA:  Preoperative chest radiograph. Status post fall. Initial encounter. EXAM: PORTABLE CHEST 1 VIEW COMPARISON:  Chest radiograph and CT of the chest performed 10/14/2010 FINDINGS: The lungs are well-aerated. Minimal bibasilar atelectasis is noted. There is no evidence of pleural effusion or pneumothorax. The cardiomediastinal silhouette is mildly enlarged. A large hiatal hernia is noted, containing fluid and air. There is a displaced fracture at the surgical neck of the left humerus. IMPRESSION: 1. Minimal bibasilar atelectasis noted.  Lungs otherwise clear. 2.  Displaced fracture at the surgical neck of the left humerus. 3. Large hiatal hernia, containing fluid and air. 4. Mild cardiomegaly. Electronically Signed   By: Garald Balding M.D.   On: 04/14/2016 00:46   Dg Shoulder Left  Result Date: 04/13/2016 CLINICAL DATA:  Fall with left shoulder pain EXAM: LEFT SHOULDER - 2+ VIEW COMPARISON:  None. FINDINGS: There is a comminuted left proximal humerus fracture involving the surgical neck and greater tuberosity, with significant impaction and approximately 1.6 cm superolateral displacement of the dominant distal fracture fragment. Left acromioclavicular joint appears intact. No dislocation at the left glenohumeral joint. No suspicious focal osseous lesions. No radiopaque foreign body. Aortic atherosclerosis. IMPRESSION: Comminuted impacted displaced left proximal humerus fracture involving the surgical neck and greater tuberosity. Electronically Signed   By: Ilona Sorrel M.D.   On: 04/13/2016 22:24   Dg Hand Complete Right  Result Date: 04/13/2016 CLINICAL DATA:  Fall.  Right hand pain. EXAM: RIGHT HAND - COMPLETE 3+ VIEW COMPARISON:  None. FINDINGS: No fracture or dislocation. Severe polyarticular erosive osteoarthritis throughout the interphalangeal joints of the first through fifth fingers. Severe osteoarthritis at the first carpometacarpal joint. Moderate osteoarthritis at the first through third metacarpophalangeal joints. No suspicious focal osseous lesions. No radiopaque foreign bodies. IMPRESSION: No fracture or dislocation in the right hand. 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 Electronically Signed   By: Ilona Sorrel M.D.   On: 04/13/2016 22:26    ROS: As noted in the medical chart. Blood pressure 128/68, pulse 74, temperature 98.5 F (36.9 C), temperature source Oral, resp. rate 20, SpO2 92 %. Physical Exam General Appearance:  Alert, cooperative, no distress, appears stated age  Head:  Normocephalic, without obvious  abnormality, atraumatic  Eyes:  Pupils equal, conjunctiva/corneas clear,         Throat: Lips, mucosa, and tongue normal; teeth and gums normal  Neck: No visible masses     Lungs:   respirations unlabored  Chest Wall:  No tenderness or deformity  Heart:  Regular rate and rhythm,  Abdomen:   Soft, non-tender,         Extremities: Limited examination the left upper extremity reveals the splint in place volarly. She is able to gently wiggle her fingers. She does have the osteoarthritic changes noted within the interphalangeal joints. She is able to gently extend her thumb and extend her fingers her fingers are warm well perfused.   Pulses:   Skin:      Neurologic: Normal   Assessment/Plan: Left wrist intra-articular distal radius fracture 3 or more fragments with open injury dorsally.  Osteoporosis  Osteoarthritis of the hand and wrist  Plan: Patient was seen and examined in the hospital. His my recommendation that the patient undergo operative restoration of the distal radius. The patient is going to need a thorough irrigation and debridement and likely dorsal wound closure possible wound VAC placement based on the wound. We will take her to the operating room this morning for operative fixation. Risks include but not limited to bleeding infection and M0 by nerves arteries or tendons loss of motion of wrists and digits incomplete relief of symptoms and need for further surgical intervention.  R/B/A DISCUSSED WITH PT IN HOSPITAL.  CONSENT SIGNED DAY OF SURGERY PT SEEN AND EXAMINED PRIOR TO OPERATIVE PROCEDURE/DAY OF SURGERY SITE MARKED. QUESTIONS ANSWERED WILL REMAIN AN INPATIENT FOLLOWING SURGERY  WE ARE PLANNING SURGERY FOR YOUR UPPER EXTREMITY. THE RISKS AND BENEFITS OF SURGERY INCLUDE BUT NOT LIMITED TO BLEEDING INFECTION, DAMAGE TO NEARBY NERVES ARTERIES TENDONS, FAILURE OF SURGERY TO ACCOMPLISH ITS INTENDED GOALS, PERSISTENT SYMPTOMS AND NEED FOR FURTHER SURGICAL INTERVENTION.  WITH THIS IN MIND WE WILL PROCEED. I HAVE DISCUSSED WITH THE PATIENT THE PRE AND POSTOPERATIVE REGIMEN AND THE DOS AND DON'TS. PT VOICED UNDERSTANDING AND INFORMED CONSENT SIGNED. [name] 04/14/2016, 9:53 AM

## 2016-04-14 NOTE — ED Notes (Signed)
XR at bedside for portable CXR

## 2016-04-14 NOTE — Transfer of Care (Signed)
Immediate Anesthesia Transfer of Care Note  Patient: [name]  Procedure(s) Performed: Procedure(s): OPEN REDUCTION INTERNAL FIXATION (ORIF) DISTAL RADIAL FRACTURE (Left)  Patient Location: PACU  Anesthesia Type:MAC, Regional and MAC combined with regional for post-op pain  Level of Consciousness: awake, oriented, sedated, patient cooperative and responds to stimulation  Airway & Oxygen Therapy: Patient Spontanous Breathing and Patient connected to nasal cannula oxygen  Post-op Assessment: Report given to RN, Post -op Vital signs reviewed and stable, Patient moving all extremities and Patient moving all extremities X 4  Post vital signs: Reviewed and stable  Last Vitals:  Vitals:   04/14/16 0148 04/14/16 0302  BP: (!) 141/61 128/68  Pulse: 70 74  Resp: 20   Temp: 36.8 C 36.9 C    Last Pain:  Vitals:   04/14/16 0302  TempSrc: Oral  PainSc:          Complications: No apparent anesthesia complications

## 2016-04-15 ENCOUNTER — Encounter (HOSPITAL_COMMUNITY): Payer: Self-pay | Admitting: Orthopedic Surgery

## 2016-04-15 DIAGNOSIS — S42292B Other displaced fracture of upper end of left humerus, initial encounter for open fracture: Secondary | ICD-10-CM

## 2016-04-15 LAB — BASIC METABOLIC PANEL
ANION GAP: 9 (ref 5–15)
BUN: 14 mg/dL (ref 6–20)
CHLORIDE: 103 mmol/L (ref 101–111)
CO2: 25 mmol/L (ref 22–32)
Calcium: 9 mg/dL (ref 8.9–10.3)
Creatinine, Ser: 1.15 mg/dL — ABNORMAL HIGH (ref 0.44–1.00)
GFR calc Af Amer: 45 mL/min — ABNORMAL LOW (ref >60)
GFR, EST NON AFRICAN AMERICAN: 39 mL/min — AB (ref >60)
Glucose, Bld: 120 mg/dL — ABNORMAL HIGH (ref 65–99)
POTASSIUM: 4.2 mmol/L (ref 3.5–5.1)
SODIUM: 137 mmol/L (ref 135–145)

## 2016-04-15 LAB — CBC
HCT: 30.1 % — ABNORMAL LOW (ref 36.0–46.0)
Hemoglobin: 9.8 g/dL — ABNORMAL LOW (ref 12.0–15.0)
MCH: 31.1 pg (ref 26.0–34.0)
MCHC: 32.6 g/dL (ref 30.0–36.0)
MCV: 95.6 fL (ref 78.0–100.0)
PLATELETS: 131 10*3/uL — AB (ref 150–400)
RBC: 3.15 MIL/uL — AB (ref 3.87–5.11)
RDW: 14 % (ref 11.5–15.5)
WBC: 13.3 10*3/uL — AB (ref 4.0–10.5)

## 2016-04-15 MED ORDER — DEXTROSE 5 % IV SOLN
1.0000 g | INTRAVENOUS | Status: DC
  Administered 2016-04-15 – 2016-04-21 (×7): 1 g via INTRAVENOUS
  Filled 2016-04-15 (×7): qty 1

## 2016-04-15 MED ORDER — VANCOMYCIN HCL 500 MG IV SOLR
500.0000 mg | INTRAVENOUS | Status: DC
  Administered 2016-04-15 – 2016-04-17 (×2): 500 mg via INTRAVENOUS
  Filled 2016-04-15 (×2): qty 500

## 2016-04-15 MED ORDER — ONDANSETRON HCL 4 MG/2ML IJ SOLN
4.0000 mg | Freq: Four times a day (QID) | INTRAMUSCULAR | Status: DC | PRN
  Administered 2016-04-15 – 2016-04-20 (×4): 4 mg via INTRAVENOUS
  Filled 2016-04-15 (×4): qty 2

## 2016-04-15 NOTE — Progress Notes (Signed)
Patient ID: [name], female   DOB: [birth year], 83 y.o.   MRN: [id number]    PROGRESS NOTE    KARRIN EISENMENGER  WUJ:[id number] DOB: 06-15-1918 DOA: 04/13/2016  PCP: Pcp Not In System   Brief Narrative:  Pt admitted for further evaluation and management for new left distal radius and proximal humerus fracture that she has sustained after an episode of fall (fell on an outstretched left upper extremity).   Assessment & Plan:   Active Problems: Comminuted left wrist intra-articular distal radius fracture of 3 or more fragments, grade 2 open distal radius fracture, left proximal humerus 3 part fracture - pt is now s/p following: post op day #1 1. Open treatment of left wrist intra-articular distal radius fracture 3 or more fragments. 2. Left wrist brachial radialis tendon release, forearm flexor release. 3. Open treatment of left wrist injury intra-articular open distal radius fracture with debridement of skin subQ tissue and bone, excisional debridement. 4. Traumatic wound laceration 7-1/2 cm repair, complex wound closure 5. Close manipulation of left proximal humerus fracture requiring manipulation and anesthesia - per ortho, pt to remain nonweightbearing in the left upper extremity - continue with the long-arm splint for a total of 2 weeks - IV antibiotics for the next 48 hours - follow-up in Dr. Glenna Durand office in approximately 2 weeks for wound check. - short arm cast immobilization for an additional 3 weeks, we'll then begin therapy regimen at that time - continue with the sling until decision is made about further intervention for the shoulder.  - currently on Vanc and will change ancef to Cefepime for broader coverage as pt with low grade fevers this AM and WBC up from 11 --> 13.3 - CBC In AM   Acute kidney injury - pre renal - improving - BMP In AM  Post op blood loss anemia - drop in Hg since admission, post op related and possible dilutional component - no  indication for transfusion at this time - CBC in AM  Thrombocytopenia - mild, reactive - CBC in AM  HTN essential - reasonable control given pain from acute illness - continue Lisinopril and Metoprolol, lasix    HLD - continue statin   DVT prophylaxis: SCD's Code Status: Full  Family Communication: Patient at bedside  Disposition Plan: to be determined   Consultants:   Ortho   Procedures: done by Dr. Melvyn Novas in 4/1   Open treatment of left wrist intra-articular distal radius fracture 3 or more fragments.  Left wrist brachial radialis tendon release, forearm flexor release.  Open treatment of left wrist injury intra-articular open distal radius fracture with debridement of skin subQ tissue and bone, excisional debridement.  Traumatic wound laceration 7-1/2 cm repair, complex wound closure  Close manipulation of left proximal humerus fracture requiring manipulation and anesthesia  Antimicrobials:   Vancomycin 4/1 -->  Cefepime 4/2 -->   Subjective: Pt reports ongoing pain in the left wrist, 4/10 in severity.   Objective: Vitals:   04/14/16 1220 04/14/16 1242 04/14/16 2150 04/15/16 0500  BP: 128/75 125/63 (!) 142/61 (!) 150/71  Pulse: 65 66 84 95  Resp: Temp: 98.1 F (36.7 C) 98.2 F (36.8 C) 98.9 F (37.2 C) 99 F (37.2 C)  TempSrc:   Oral Oral  SpO2: 97% 97% 94% 93%    Intake/Output Summary (Last 24 hours) at 04/15/16 9562 Last data filed at 04/15/16 0321  Gross per 24 hour  Intake  850 ml  Output                5 ml  Net              845 ml   There were no vitals filed for this visit.  Examination:  General exam: Appears calm and comfortable  Respiratory system: Clear to auscultation. Respiratory effort normal. Cardiovascular system: S1 & S2 heard, RRR. No JVD, rubs, gallops or clicks. No pedal edema. Gastrointestinal system: Abdomen is nondistended, soft and nontender. No organomegaly or masses felt. Central nervous  system: Alert and oriented. No focal neurological deficits. Extremities: left wrist, arm still with significant TTP, in splint  Psychiatry: Judgement and insight appear normal. Mood & affect appropriate.   Data Reviewed: I have personally reviewed following labs and imaging studies  CBC:  Recent Labs Lab 04/12/16 1918 04/13/16 2226 04/14/16 0521 04/15/16 0536  WBC 7.7 12.7* 11.5* 13.3*  NEUTROABS 5.4 10.1*  --   --   HGB 13.3 12.0 11.0* 9.8*  HCT 40.6 37.6 34.4* 30.1*  MCV 96.0 97.7 96.9 95.6  PLT 176 157 153 131*   Basic Metabolic Panel:  Recent Labs Lab 04/12/16 1918 04/13/16 2226 04/14/16 0521 04/15/16 0536  NA 141 140 140 137  K 3.7 4.4 4.2 4.2  CL 105 107 104 103  CO2 GLUCOSE 117* 125* 140* 120*  BUN CREATININE 1.22* 1.22* 1.22* 1.15*  CALCIUM 9.2 9.0 9.4 9.0   Coagulation Profile:  Recent Labs Lab 04/13/16 2226  INR 0.94   CBG:  Recent Labs Lab 04/12/16 1900 04/14/16 0608  GLUCAP 113* 129*   Urine analysis:    Component Value Date/Time   COLORURINE YELLOW 04/12/2016 1857   APPEARANCEUR CLEAR 04/12/2016 1857   LABSPEC 1.006 04/12/2016 1857   PHURINE 5.5 04/12/2016 1857   GLUCOSEU NEGATIVE 04/12/2016 1857   HGBUR NEGATIVE 04/12/2016 1857   BILIRUBINUR NEGATIVE 04/12/2016 1857   KETONESUR NEGATIVE 04/12/2016 1857   PROTEINUR NEGATIVE 04/12/2016 1857   UROBILINOGEN 1.0 03/01/2010 1654   NITRITE NEGATIVE 04/12/2016 1857   LEUKOCYTESUR NEGATIVE 04/12/2016 1857   Recent Results (from the past 240 hour(s))  Urine culture     Status: None   Collection Time: 04/12/16  6:57 PM  Result Value Ref Range Status   Specimen Description URINE, RANDOM  Final   Special Requests NONE  Final   Culture   Final    NO GROWTH Performed at [facility], [address] [place], [place] [address]    Report Status 04/14/2016 FINAL  Final  Surgical pcr screen     Status: None   Collection Time: 04/14/16  4:31 AM  Result Value  Ref Range Status   MRSA, PCR NEGATIVE NEGATIVE Final   Staphylococcus aureus NEGATIVE NEGATIVE Final    Radiology Studies: Dg Wrist Complete Left  Result Date: 04/13/2016 CLINICAL DATA:  Trip and fall.  LEFT forearm bleeding and deformity. EXAM: LEFT WRIST - COMPLETE 3+ VIEW COMPARISON:  None. FINDINGS: Acute comminuted distal radial fracture with intra-articular extension and impaction. Mild volar angulation of distal bony fragments. Acute distal ulnar fracture with moderate volar angulation of distal bony fragments, no intra-articular extension. No dislocation. No destructive bony lesions. Osteopenia. Severe first carpometacarpal osteoarthrosis with joint space narrowing, periarticular sclerosis and marginal spurring. Thick Bandage overlies the dorsal wrist. Minimal subcutaneous gas within the medial wrist, no definite radiopaque foreign bodies. IMPRESSION: Acute displaced distal radius and  ulnar open fractures. No dislocation. Electronically Signed   By: Awilda Metro M.D.   On: 04/13/2016 22:25   Dg Chest Port 1 View  Result Date: 04/14/2016 CLINICAL DATA:  Preoperative chest radiograph. Status post fall. Initial encounter. EXAM: PORTABLE CHEST 1 VIEW COMPARISON:  Chest radiograph and CT of the chest performed 10/14/2010 FINDINGS: The lungs are well-aerated. Minimal bibasilar atelectasis is noted. There is no evidence of pleural effusion or pneumothorax. The cardiomediastinal silhouette is mildly enlarged. A large hiatal hernia is noted, containing fluid and air. There is a displaced fracture at the surgical neck of the left humerus. IMPRESSION: 1. Minimal bibasilar atelectasis noted.  Lungs otherwise clear. 2. Displaced fracture at the surgical neck of the left humerus. 3. Large hiatal hernia, containing fluid and air. 4. Mild cardiomegaly. Electronically Signed   By: Roanna Raider M.D.   On: 04/14/2016 00:46   Dg Shoulder Left  Result Date: 04/14/2016 CLINICAL DATA:  Proximal left humerus  fracture; she reports fall several days ago; her entire left arm was bruised EXAM: LEFT SHOULDER - 2+ VIEW COMPARISON:  04/13/2016 FINDINGS: There is a comminuted fracture of the proximal humerus. There is a transverse fracture across metaphysis. There is a fracture across the base of the greater tuberosity. The shaft fracture component has displaced superiorly, 11 mm, in relation to the humerus. There is varus angulation between the humeral head and shaft components. Humeral head is mildly subluxed inferiorly suggesting a joint hemarthrosis. No dislocation. AC joint is normally aligned. IMPRESSION: 1. Mildly displaced comminuted fracture of the proximal left humerus. Electronically Signed   By: Amie Portland M.D.   On: 04/14/2016 15:02   Dg Shoulder Left  Result Date: 04/13/2016 CLINICAL DATA:  Fall with left shoulder pain EXAM: LEFT SHOULDER - 2+ VIEW COMPARISON:  None. FINDINGS: There is a comminuted left proximal humerus fracture involving the surgical neck and greater tuberosity, with significant impaction and approximately 1.6 cm superolateral displacement of the dominant distal fracture fragment. Left acromioclavicular joint appears intact. No dislocation at the left glenohumeral joint. No suspicious focal osseous lesions. No radiopaque foreign body. Aortic atherosclerosis. IMPRESSION: Comminuted impacted displaced left proximal humerus fracture involving the surgical neck and greater tuberosity. Electronically Signed   By: Delbert Phenix M.D.   On: 04/13/2016 22:24   Dg Hand Complete Right  Result Date: 04/13/2016 CLINICAL DATA:  Fall.  Right hand pain. EXAM: RIGHT HAND - COMPLETE 3+ VIEW COMPARISON:  None. FINDINGS: No fracture or dislocation. Severe polyarticular erosive osteoarthritis throughout the interphalangeal joints of the first through fifth fingers. Severe osteoarthritis at the first carpometacarpal joint. Moderate osteoarthritis at the first through third metacarpophalangeal joints. No  suspicious focal osseous lesions. No radiopaque foreign bodies. IMPRESSION: No fracture or dislocation in the right hand. 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 Electronically Signed   By: Delbert Phenix M.D.   On: 04/13/2016 22:26   Scheduled Meds: .  ceFAZolin (ANCEF) IV  1 g Intravenous Q12H  . furosemide  20 mg Oral QHS  . lisinopril  5 mg Oral Daily  . loratadine  10 mg Oral Daily  . metoprolol succinate  25 mg Oral Daily  . multivitamin with minerals  1 tablet Oral Daily  . pantoprazole  40 mg Oral Daily  . predniSONE  5 mg Oral Daily  . simvastatin  40 mg Oral q1800  . vancomycin  500 mg Intravenous Q48H   Continuous Infusions:   LOS: 2 days   Time spent: 20  minutes   Debbora Presto, MD [facility] Pager [phone]  If 7PM-7AM, please contact night-coverage [email or web address] Password [facility] 04/15/2016, 9:37 AM

## 2016-04-16 DIAGNOSIS — Z01818 Encounter for other preprocedural examination: Secondary | ICD-10-CM

## 2016-04-16 LAB — CBC
HCT: 28.4 % — ABNORMAL LOW (ref 36.0–46.0)
Hemoglobin: 9.4 g/dL — ABNORMAL LOW (ref 12.0–15.0)
MCH: 31.6 pg (ref 26.0–34.0)
MCHC: 33.1 g/dL (ref 30.0–36.0)
MCV: 95.6 fL (ref 78.0–100.0)
Platelets: 134 10*3/uL — ABNORMAL LOW (ref 150–400)
RBC: 2.97 MIL/uL — ABNORMAL LOW (ref 3.87–5.11)
RDW: 13.9 % (ref 11.5–15.5)
WBC: 10.7 10*3/uL — ABNORMAL HIGH (ref 4.0–10.5)

## 2016-04-16 LAB — BASIC METABOLIC PANEL WITH GFR
Anion gap: 9 (ref 5–15)
BUN: 13 mg/dL (ref 6–20)
CO2: 28 mmol/L (ref 22–32)
Calcium: 9.4 mg/dL (ref 8.9–10.3)
Chloride: 103 mmol/L (ref 101–111)
Creatinine, Ser: 1.1 mg/dL — ABNORMAL HIGH (ref 0.44–1.00)
GFR calc Af Amer: 48 mL/min — ABNORMAL LOW
GFR calc non Af Amer: 41 mL/min — ABNORMAL LOW
Glucose, Bld: 92 mg/dL (ref 65–99)
Potassium: 4 mmol/L (ref 3.5–5.1)
Sodium: 140 mmol/L (ref 135–145)

## 2016-04-16 MED ORDER — ENSURE ENLIVE PO LIQD
237.0000 mL | Freq: Two times a day (BID) | ORAL | Status: DC
  Administered 2016-04-17: 237 mL via ORAL

## 2016-04-16 NOTE — Clinical Social Work Note (Signed)
CSW spoke with pt and pt's daughter at bedside. At this time pt and pt's daughter is refusing SNF. Pt's daughter reports she will be home with her mom every day, all but 4 hours of the day. Pt's daughter requesting a list of "caregivers" who could come in during those 4 hours she is at work to help her mom. CSW reached out to [facility]. RNCM will provide her with a list.   Velora Mediate, LCSWA [phone]

## 2016-04-16 NOTE — Progress Notes (Signed)
Patient ID: [name], female   DOB: 24-Mar-1918, 83 y.o.   MRN: [id number]    PROGRESS NOTE  [name]  FAO:[id number] DOB: 27-Dec-1918 DOA: 04/13/2016  PCP: Pcp Not In System   Brief Narrative:  Pt admitted for further evaluation and management for new left distal radius and proximal humerus fracture that she has sustained after an episode of fall (fell on an outstretched left upper extremity).   Assessment & Plan:   Active Problems: Comminuted left wrist intra-articular distal radius fracture of 3 or more fragments, grade 2 open distal radius fracture, left proximal humerus 3 part fracture - pt is now s/p following: post op day #2 1. Open treatment of left wrist intra-articular distal radius fracture 3 or more fragments. 2. Left wrist brachial radialis tendon release, forearm flexor release. 3. Open treatment of left wrist injury intra-articular open distal radius fracture with debridement of skin subQ tissue and bone, excisional debridement. 4. Traumatic wound laceration 7-1/2 cm repair, complex wound closure 5. Close manipulation of left proximal humerus fracture requiring manipulation and anesthesia - per ortho, pt to remain nonweightbearing in the left upper extremity - continue with the long-arm splint for a total of 2 weeks - continue with IV ABX at least for another 24 hours as pt is still febrile with Tmax 100.5 F (Dr. Melvyn Novas initially recommended total 48 hours of ABX post surgery) - follow-up in Dr. Glenna Durand office in approximately 2 weeks for wound check. - short arm cast immobilization for an additional 3 weeks, we'll then begin therapy regimen at that time - continue with the sling until decision is made about further intervention for the shoulder.  - currently on Vanc and Cefepime, today is day #3 of Vancomycin and day #2 of Cefepime, WBC is trending down but since pt still febrile, will continue one more day of IV ABX - CBC In AM   Acute kidney injury -  pre renal - improving - BMP In AM  Post op blood loss anemia - drop in Hg since admission, post op related and possible dilutional component - no indication for transfusion at this time, Hg stable at 9.4 this AM  - CBC in AM  Thrombocytopenia - mild, reactive but overall stable  - CBC in AM  HTN essential - reasonable control given pain from acute illness - continue Lisinopril and Metoprolol, lasix    HLD - continue statin   DVT prophylaxis: SCD's Code Status: Full  Family Communication: Patient at bedside, left message for pt's daughter on her cell phone and to call me back at her convenience  Disposition Plan: pt will be going home once fever resolved and once able to take off IV ABX  Consultants:   Ortho   Procedures: done by Dr. Melvyn Novas in 4/1   Open treatment of left wrist intra-articular distal radius fracture 3 or more fragments.  Left wrist brachial radialis tendon release, forearm flexor release.  Open treatment of left wrist injury intra-articular open distal radius fracture with debridement of skin subQ tissue and bone, excisional debridement.  Traumatic wound laceration 7-1/2 cm repair, complex wound closure  Close manipulation of left proximal humerus fracture requiring manipulation and anesthesia  Antimicrobials:   Vancomycin 4/1 -->  Cefepime 4/2 -->   Subjective: Pt reports ongoing pain in the left wrist, 4/10 in severity.   Objective: Vitals:   04/15/16 0500 04/15/16 1439 04/15/16 2300 04/16/16 0624  BP: (!) 150/71 (!) 143/61 (!) 145/60 137/64  Pulse: 95 92  88 72  Resp: 16 16    Temp: 99 F (37.2 C) 98.2 F (36.8 C) 99 F (37.2 C) (!) 100.5 F (38.1 C)  TempSrc: Oral Oral Oral Oral  SpO2: 93% 93% 94% 93%    Intake/Output Summary (Last 24 hours) at 04/16/16 1108 Last data filed at 04/16/16 1040  Gross per 24 hour  Intake              940 ml  Output              200 ml  Net              740 ml   There were no vitals filed for this  visit.  Examination:  General exam: Appears calm and comfortable, pleasant but says she is still in pain  Respiratory system: Clear to auscultation. Respiratory effort normal. Cardiovascular system: S1 & S2 heard, RRR. No JVD, rubs, gallops or clicks.  Gastrointestinal system: Abdomen is nondistended, soft and nontender. No organomegaly or masses felt. Central nervous system: Alert and oriented. No focal neurological deficits. Extremities: left wrist, arm still with significant TTP, in splint   Data Reviewed: I have personally reviewed following labs and imaging studies  CBC:  Recent Labs Lab 04/12/16 1918 04/13/16 2226 04/14/16 0521 04/15/16 0536 04/16/16 0607  WBC 7.7 12.7* 11.5* 13.3* 10.7*  NEUTROABS 5.4 10.1*  --   --   --   HGB 13.3 12.0 11.0* 9.8* 9.4*  HCT 40.6 37.6 34.4* 30.1* 28.4*  MCV 96.0 97.7 96.9 95.6 95.6  PLT 176 157 153 131* 134*   Basic Metabolic Panel:  Recent Labs Lab 04/12/16 1918 04/13/16 2226 04/14/16 0521 04/15/16 0536 04/16/16 0607  NA 141 140 140 137 140  K 3.7 4.4 4.2 4.2 4.0  CL 105 107 104 103 103  CO2 GLUCOSE 117* 125* 140* 120* 92  BUN CREATININE 1.22* 1.22* 1.22* 1.15* 1.10*  CALCIUM 9.2 9.0 9.4 9.0 9.4   Coagulation Profile:  Recent Labs Lab 04/13/16 2226  INR 0.94   CBG:  Recent Labs Lab 04/12/16 1900 04/14/16 0608  GLUCAP 113* 129*   Urine analysis:    Component Value Date/Time   COLORURINE YELLOW 04/12/2016 1857   APPEARANCEUR CLEAR 04/12/2016 1857   LABSPEC 1.006 04/12/2016 1857   PHURINE 5.5 04/12/2016 1857   GLUCOSEU NEGATIVE 04/12/2016 1857   HGBUR NEGATIVE 04/12/2016 1857   BILIRUBINUR NEGATIVE 04/12/2016 1857   KETONESUR NEGATIVE 04/12/2016 1857   PROTEINUR NEGATIVE 04/12/2016 1857   UROBILINOGEN 1.0 03/01/2010 1654   NITRITE NEGATIVE 04/12/2016 1857   LEUKOCYTESUR NEGATIVE 04/12/2016 1857   Recent Results (from the past 240 hour(s))  Urine culture     Status: None    Collection Time: 04/12/16  6:57 PM  Result Value Ref Range Status   Specimen Description URINE, RANDOM  Final   Special Requests NONE  Final   Culture   Final    NO GROWTH Performed at [facility], [address] [place], [place] [address]    Report Status 04/14/2016 FINAL  Final  Surgical pcr screen     Status: None   Collection Time: 04/14/16  4:31 AM  Result Value Ref Range Status   MRSA, PCR NEGATIVE NEGATIVE Final   Staphylococcus aureus NEGATIVE NEGATIVE Final    Radiology Studies: Dg Shoulder Left  Result Date: 04/14/2016 CLINICAL DATA:  Proximal left humerus fracture; she reports fall several  days ago; her entire left arm was bruised EXAM: LEFT SHOULDER - 2+ VIEW COMPARISON:  04/13/2016 FINDINGS: There is a comminuted fracture of the proximal humerus. There is a transverse fracture across metaphysis. There is a fracture across the base of the greater tuberosity. The shaft fracture component has displaced superiorly, 11 mm, in relation to the humerus. There is varus angulation between the humeral head and shaft components. Humeral head is mildly subluxed inferiorly suggesting a joint hemarthrosis. No dislocation. AC joint is normally aligned. IMPRESSION: 1. Mildly displaced comminuted fracture of the proximal left humerus. Electronically Signed   By: Amie Portland M.D.   On: 04/14/2016 15:02   Scheduled Meds: . ceFEPime (MAXIPIME) IV  1 g Intravenous Q24H  . furosemide  20 mg Oral QHS  . lisinopril  5 mg Oral Daily  . loratadine  10 mg Oral Daily  . metoprolol succinate  25 mg Oral Daily  . multivitamin with minerals  1 tablet Oral Daily  . pantoprazole  40 mg Oral Daily  . predniSONE  5 mg Oral Daily  . simvastatin  40 mg Oral q1800  . vancomycin  500 mg Intravenous Q48H   Continuous Infusions:   LOS: 3 days   Time spent: 20 minutes   Debbora Presto, MD [facility] Pager [phone]  If 7PM-7AM, please contact  night-coverage [email or web address] Password [email or web address] 04/16/2016, 11:08 AM

## 2016-04-16 NOTE — Evaluation (Signed)
Occupational Therapy Evaluation Patient Details Name: [name] MRN: [id number] DOB: 29-Oct-1918 Today's Date: 04/16/2016    History of Present Illness 83 yo female s/p fall with Comminuted left wrist intra-articular distal radius fracture of 3 or more fragments, grade 2 open distal radius fracture, left proximal humerus 3 part fracture. s/p 4/1 L wrist brachial radialis tendon release, forearm flexor release, open treatmet of L wrist open distal fx with debridement of skin tissue and bone with excisional debridement, closed manipulation of L proximal humerus fx , biomet DVR cross lock narrow plate with several CC of biomet stay graft bone graft substitute.   Clinical Impression   Patient is s/p L wrist plate with debridementand closed manipulation of L proximal humerus fx surgery resulting in functional limitations due to the deficits listed below (see OT problem list). PTA was living with daughter walking without device. Pt reports "this was just a stupid mistake." Pt and daughter educated on positioning and ROM at digits.  Patient will benefit from skilled OT acutely to increase independence and safety with ADLS to allow discharge HHOT and aide requested by family. Pt needs 3n1 to decr fall risk with toilet transfers.     Follow Up Recommendations  Home health OT;Other (comment) (home aide)    Equipment Recommendations  3 in 1 bedside commode    Recommendations for Other Services       Precautions / Restrictions Precautions Precautions: Fall Precaution Comments: sling  Required Braces or Orthoses: Sling Restrictions Weight Bearing Restrictions: Yes RUE Weight Bearing: Non weight bearing      Mobility Bed Mobility Overal bed mobility: Needs Assistance Bed Mobility: Supine to Sit     Supine to sit: Mod assist     General bed mobility comments: pt c/o discomfort at buttock  Transfers Overall transfer level: Needs assistance Equipment used: 1 person hand held  assist Transfers: Sit to/from Stand Sit to Stand: Mod assist         General transfer comment: cues for safety     Balance Overall balance assessment: Needs assistance Sitting-balance support: Single extremity supported;Feet supported Sitting balance-Leahy Scale: Fair     Standing balance support: Single extremity supported;During functional activity Standing balance-Leahy Scale: Poor                             ADL either performed or assessed with clinical judgement   ADL Overall ADL's : Needs assistance/impaired Eating/Feeding: Minimal assistance;Sitting   Grooming: Wash/dry face;Minimal assistance;Sitting                   Toilet Transfer: Moderate assistance;BSC Toilet Transfer Details (indicate cue type and reason): cues for safety and needs (A) for balance Toileting- Clothing Manipulation and Hygiene: Moderate assistance;Sit to/from stand Toileting - Clothing Manipulation Details (indicate cue type and reason): pt able to complete anterior peri care static sitting but needs (A) for posterior peri care     Functional mobility during ADLs: Moderate assistance General ADL Comments: Pt needs (A) with sling total (A) to adjust to proper alignment. Pt positioned in chair with ice and proper pillow alignment     Vision Patient Visual Report: Blurring of vision Vision Assessment?: Vision impaired- to be further tested in functional context Additional Comments: pt reports that her eyes are bad and was unable to tell that her daughter had arrived in the room with her 10 feet away but knew as soon as she hurt her voice  Perception     Praxis      Pertinent Vitals/Pain Pain Assessment: Faces Faces Pain Scale: Hurts whole lot Pain Location: L UE  Pain Descriptors / Indicators: Discomfort;Grimacing Pain Intervention(s): Monitored during session;Premedicated before session;Repositioned;Ice applied;Limited activity within patient's tolerance      Hand Dominance Right   Extremity/Trunk Assessment Upper Extremity Assessment Upper Extremity Assessment: LUE deficits/detail LUE Deficits / Details: sling with edema noted at the MCP with splint on the dorsal aspect of the skin pushing into the patients very thin fair skin. RN notified to alert MD. pt reports that splint hurts and edema noted at the top of the splint as well. At the conclusion of session Rn contacted MD who stated dressing could be reduced. This OT contacted peer to help with two person (A) of splint managment. ( see wendi OT note regarding) pt reports inability to feel thumb LUE Sensation: decreased light touch LUE Coordination: decreased fine motor;decreased gross motor   Lower Extremity Assessment Lower Extremity Assessment: Generalized weakness   Cervical / Trunk Assessment Cervical / Trunk Assessment: Kyphotic   Communication Communication Communication: No difficulties   Cognition Arousal/Alertness: Awake/alert Behavior During Therapy: WFL for tasks assessed/performed Overall Cognitive Status: Within Functional Limits for tasks assessed                                     General Comments  Pt noted to have edema at MCP and the top of the splint. Pt currently with risk for skin break down at the palm. Pt needs modification for sugar tong splint to prevent skin break down    Exercises Exercises: Other exercises Other Exercises Other Exercises: digit flexion and extension Other Exercises: hand over elbow elevation within tolerance   Shoulder Instructions      Home Living Family/patient expects to be discharged to:: Private residence Living Arrangements: Children Available Help at Discharge: Family;Available 24 hours/day (except 4 hours daily) Type of Home: House Home Access: Level entry     Home Layout: One level     Bathroom Shower/Tub: Chief Strategy Officer: Standard     Home Equipment: Grab bars - tub/shower    Additional Comments: pt has a condo with daughter in her room she has a bed , sitting area with recliner and tv and a personal bathroom. pt has a dog "bella" that is 35 yo that sleeps with her at night      Prior Functioning/Environment Level of Independence: Independent        Comments: pt reports I was doing it all until here lately i am just so clumsy         OT Problem List: Decreased strength;Decreased range of motion;Decreased activity tolerance;Impaired balance (sitting and/or standing);Impaired vision/perception;Decreased safety awareness;Decreased knowledge of use of DME or AE;Decreased knowledge of precautions;Impaired UE functional use;Pain;Increased edema      OT Treatment/Interventions: Self-care/ADL training;Therapeutic exercise;Neuromuscular education;DME and/or AE instruction;Therapeutic activities;Visual/perceptual remediation/compensation;Patient/family education;Balance training    OT Goals(Current goals can be found in the care plan section) Acute Rehab OT Goals Patient Stated Goal: to go to the beach OT Goal Formulation: With patient/family Time For Goal Achievement: 04/30/16 Potential to Achieve Goals: Good  OT Frequency: Min 2X/week   Barriers to D/C: Other (comment) (daughter works 4 hours a day moving buses)  daughter wants an Engineer, production to help with care       Co-evaluation  End of Session Equipment Utilized During Treatment: Other (comment) (sling) Nurse Communication: Mobility status;Precautions;Weight bearing status  Activity Tolerance: Patient tolerated treatment well Patient left: in chair;with call bell/phone within reach;with chair alarm set;with family/visitor present  OT Visit Diagnosis: Pain Pain - Right/Left: Left Pain - part of body: Arm;Hand;Shoulder                Time: 1610-9604 OT Time Calculation (min): 46 min Charges:  OT General Charges $OT Visit: 1 Procedure OT Evaluation $OT Eval Moderate Complexity: 1  Procedure OT Treatments $Self Care/Home Management : 23-37 mins $Orthotics Fit/Training: 38-52 mins G-Codes:      Mateo Flow   OTR/L Pager: [phone] Office: [phone] .   Boone Master B 04/16/2016, 1:42 PM

## 2016-04-16 NOTE — Progress Notes (Signed)
Occupational Therapy Progress Note  Pt with moderate edema of Lt hand with c/o pain from post op splint and obvious early signs of pressure placing her at high risk for wound.  Splint removed, with padding added distal portion of hand volar and dorsal and moleskin added to the sides due to non padded edges of fiberglass cast/splint.   Splint reapplied with pt reporting significant reduction in pain and improved comfort.    04/16/16 1200  OT Visit Information  Last OT Received On 04/16/16  Assistance Needed +1  History of Present Illness 83 yo female s/p fall with   Precautions  Precautions Fall  Precaution Comments sling   Required Braces or Orthoses Sling  Restrictions  Weight Bearing Restrictions Yes  RUE Weight Bearing NWB  General Comments  General comments (skin integrity, edema, etc.) Pt with moderate edema of hand with post op splint fitting tightly  with jagged edges of fiberglass splint digging into palm and dorsum of hand.   Per RN, Dr Melvyn Novas, gave the okay to rewrap the cast.  Cast/splint removed the volar surface was padded with silicone gel and polycushion padding added to dorsum portion of splint.  All side edges covered with moleskin.   Splint reapplied and rewrapped.  Pt reports signficant reduction in pain and now reports splint feels comfortable.   The gel padding causes the MCP of digit V to rest in ~30* flexion, but she is able to achieve full PROM of MCP.  Instructed daughter to perform PROM of MCP several times/day to prevent extension contracture - she verbalized understanding      OT - End of Session  Activity Tolerance Patient tolerated treatment well  Patient left in chair;with call bell/phone within reach;with family/visitor present  Nurse Communication Other (comment) (splint adjustmetn )  OT Assessment/Plan  OT Plan Discharge plan remains appropriate  OT Visit Diagnosis Pain  Pain - Right/Left Left  Pain - part of body Arm;Hand;Shoulder  OT Frequency (ACUTE  ONLY) Min 2X/week  Follow Up Recommendations Home health OT;Other (comment) (aide )  OT Equipment 3 in 1 bedside commode  AM-PAC OT "6 Clicks" Daily Activity Outcome Measure  Help from another person eating meals? 2  Help from another person taking care of personal grooming? 2  Help from another person toileting, which includes using toliet, bedpan, or urinal? 2  Help from another person bathing (including washing, rinsing, drying)? 2  Help from another person to put on and taking off regular upper body clothing? 2  Help from another person to put on and taking off regular lower body clothing? 1  6 Click Score 11  ADL G Code Conversion CL  OT Goal Progression  Progress towards OT goals Progressing toward goals  OT Time Calculation  OT Start Time (ACUTE ONLY) 1138  OT Stop Time (ACUTE ONLY) 1225  OT Time Calculation (min) 47 min  OT General Charges  $OT Visit 1 Procedure  OT Treatments  $Orthotics Fit/Training 38-52 mins  [employer], OTR/L [phone]

## 2016-04-16 NOTE — Progress Notes (Signed)
Patients left arm is swelling, cast is longer than gauze under the splint. MD office notified. Nurse will elevate the arm

## 2016-04-16 NOTE — Evaluation (Signed)
Physical Therapy Evaluation Patient Details Name: [name] MRN: [id number] DOB: May 09, 1918 Today's Date: 04/16/2016   History of Present Illness  83 yo female s/p fall with Comminuted left wrist intra-articular distal radius fracture of 3 or more fragments, grade 2 open distal radius fracture, left proximal humerus 3 part fracture. s/p 4/1 L wrist brachial radialis tendon release, forearm flexor release, open treatmet of L wrist open distal fx with debridement of skin tissue and bone with excisional debridement, closed manipulation of L proximal humerus fx , biomet DVR cross lock narrow plate with several CC of biomet stay graft bone graft substitute.  Clinical Impression   Patient is s/p above surgery resulting in functional limitations due to the deficits listed below (see PT Problem List). Ms. Cathell presents with incr fall risk, occasionally erratic gait pattern, and cognitive impairments; Noting the plan is to dc home, and while home is often the most therapeutic place, if she does not have 24 hour assist at home, we MUST consider a higher level of care; Daughter was not present this session to discuss options for supervision for Ms. Sanjose while her daughter is at work;  Patient will benefit from skilled PT to increase their independence and safety with mobility to allow discharge to the venue listed below.       Follow Up Recommendations Home health PT;Supervision/Assistance - 24 hour;Other (comment) (HHOT, HHAide)    Equipment Recommendations  Cane    Recommendations for Other Services Other (comment) (Case Mgmgnt for HHservices)     Precautions / Restrictions Precautions Precautions: Fall Precaution Comments: sling  Required Braces or Orthoses: Sling Restrictions LUE Weight Bearing: Non weight bearing      Mobility  Bed Mobility Overal bed mobility: Needs Assistance Bed Mobility: Supine to Sit     Supine to sit: Min assist     General bed mobility comments: Cues  for technique; good bridge to EOB; got up from R side of bed to decr risk of bearing weight through LUE; Min handheld assist with pt ;ulling up with RUE  Transfers Overall transfer level: Needs assistance Equipment used: 1 person hand held assist Transfers: Sit to/from Stand Sit to Stand: Min assist         General transfer comment: cues for safety   Ambulation/Gait Ambulation/Gait assistance: Min assist   Assistive device: 1 person hand held assist Gait Pattern/deviations: Step-through pattern;Narrow base of support (toe-out; slightly erratic step width)     General Gait Details: Min assist for balance and support; noting impulsivity as well  Stairs            Wheelchair Mobility    Modified Rankin (Stroke Patients Only)       Balance Overall balance assessment: Needs assistance Sitting-balance support: Single extremity supported;Feet supported Sitting balance-Leahy Scale: Fair     Standing balance support: Single extremity supported;During functional activity Standing balance-Leahy Scale: Poor                               Pertinent Vitals/Pain Pain Assessment: Faces Faces Pain Scale: Hurts even more Pain Location: L UE  Pain Descriptors / Indicators: Discomfort;Grimacing Pain Intervention(s): Monitored during session;Other (comment) (Adjusted sling fit)    Home Living Family/patient expects to be discharged to:: Private residence Living Arrangements: Children Available Help at Discharge: Family;Available 24 hours/day (except 4 hours daily) Type of Home: House Home Access: Level entry     Home Layout: One level Home  Equipment: Grab bars - tub/shower Additional Comments: pt has a condo with daughter in her room she has a bed , sitting area with recliner and tv and a personal bathroom. pt has a dog "bella" that is 77 yo that sleeps with her at night    Prior Function Level of Independence: Independent         Comments: pt reports I  was doing it all until here lately i am just so clumsy      Hand Dominance   Dominant Hand: Right    Extremity/Trunk Assessment   Upper Extremity Assessment Upper Extremity Assessment: Defer to OT evaluation    Lower Extremity Assessment Lower Extremity Assessment: Generalized weakness    Cervical / Trunk Assessment Cervical / Trunk Assessment: Kyphotic  Communication   Communication: No difficulties  Cognition Arousal/Alertness: Awake/alert Behavior During Therapy: WFL for tasks assessed/performed Overall Cognitive Status: Impaired/Different from baseline Area of Impairment: Memory;Safety/judgement;Orientation                 Orientation Level: Disoriented to;Place;Time;Situation   Memory: Decreased recall of precautions;Decreased short-term memory   Safety/Judgement: Decreased awareness of safety;Decreased awareness of deficits     General Comments: Numerous times during session pt stated she feel, and that her arm hurt, but unable to identify that she has ; referring to her cast, she indicated her daughter put it on her; needing cues to discern that she is in the hospital; this does seem like a decline from earlier today      General Comments      Exercises     Assessment/Plan    PT Assessment Patient needs continued PT services  PT Problem List Decreased strength       PT Treatment Interventions DME instruction;Gait training;Functional mobility training;Therapeutic activities;Therapeutic exercise;Balance training;Neuromuscular re-education;Cognitive remediation;Patient/family education    PT Goals (Current goals can be found in the Care Plan section)  Acute Rehab PT Goals Patient Stated Goal: wanting to walk PT Goal Formulation: Patient unable to participate in goal setting Time For Goal Achievement: 04/30/16 Potential to Achieve Goals: Good    Frequency Min 6X/week   Barriers to discharge Decreased caregiver support Must arrange for 24 hour  reliable assist to safely dc home    Co-evaluation               End of Session Equipment Utilized During Treatment: Gait belt   Patient left: in bed;with call bell/phone within reach;with bed alarm set Nurse Communication: Mobility status PT Visit Diagnosis: Unsteadiness on feet (R26.81);Pain Pain - Right/Left: Left Pain - part of body: Shoulder    Time: 1430-1455 PT Time Calculation (min) (ACUTE ONLY): 25 min   Charges:   PT Evaluation $PT Eval Moderate Complexity: 1 Procedure PT Treatments $Gait Training: 8-22 mins   PT G Codes:        Van Clines, PT  Acute Rehabilitation Services Pager [phone] Office [phone]   Levi Aland 04/16/2016, 5:00 PM

## 2016-04-16 NOTE — Progress Notes (Signed)
OT NOTE  Pt and daughter c/o metal sticking patient in the palm. Pt noted to have casting that is longer than wound dressing and abrasive edge against the patients palm. Pt very fair skin and very high risk for skin tears. RN notified. Rn contacting MD regarding risk for skin break down due to cast. Pt currently with a gaze over the affected area from a PM RN   Mateo Flow   OTR/L Pager: [phone] Office: [phone] .

## 2016-04-17 LAB — BASIC METABOLIC PANEL
ANION GAP: 8 (ref 5–15)
BUN: 17 mg/dL (ref 6–20)
CO2: 34 mmol/L — AB (ref 22–32)
Calcium: 9.8 mg/dL (ref 8.9–10.3)
Chloride: 98 mmol/L — ABNORMAL LOW (ref 101–111)
Creatinine, Ser: 1.24 mg/dL — ABNORMAL HIGH (ref 0.44–1.00)
GFR calc Af Amer: 41 mL/min — ABNORMAL LOW (ref >60)
GFR, EST NON AFRICAN AMERICAN: 36 mL/min — AB (ref >60)
GLUCOSE: 112 mg/dL — AB (ref 65–99)
POTASSIUM: 4.7 mmol/L (ref 3.5–5.1)
Sodium: 140 mmol/L (ref 135–145)

## 2016-04-17 LAB — CBC
HEMATOCRIT: 32.6 % — AB (ref 36.0–46.0)
HEMOGLOBIN: 10.6 g/dL — AB (ref 12.0–15.0)
MCH: 30.9 pg (ref 26.0–34.0)
MCHC: 32.5 g/dL (ref 30.0–36.0)
MCV: 95 fL (ref 78.0–100.0)
Platelets: 190 10*3/uL (ref 150–400)
RBC: 3.43 MIL/uL — ABNORMAL LOW (ref 3.87–5.11)
RDW: 13.6 % (ref 11.5–15.5)
WBC: 12 10*3/uL — AB (ref 4.0–10.5)

## 2016-04-17 LAB — GLUCOSE, CAPILLARY
Glucose-Capillary: 94 mg/dL (ref 65–99)
Glucose-Capillary: 124 mg/dL — ABNORMAL HIGH (ref 65–99)

## 2016-04-17 MED ORDER — BOOST / RESOURCE BREEZE PO LIQD
1.0000 | Freq: Two times a day (BID) | ORAL | Status: DC
  Administered 2016-04-17 – 2016-04-21 (×9): 1 via ORAL

## 2016-04-17 NOTE — Progress Notes (Signed)
Occupational Therapy Treatment Patient Details Name: [name] MRN: [id number] DOB: January 08, 1919 Today's Date: 04/17/2016    History of present illness 83 yo female s/p fall with Comminuted left wrist intra-articular distal radius fracture of 3 or more fragments, grade 2 open distal radius fracture, left proximal humerus 3 part fracture. s/p 4/1 L wrist brachial radialis tendon release, forearm flexor release, open treatmet of L wrist open distal fx with debridement of skin tissue and bone with excisional debridement, closed manipulation of L proximal humerus fx , biomet DVR cross lock narrow plate with several CC of biomet stay graft bone graft substitute.   OT comments  Pt drinking boost "wild berry" at the end of session provide by OT due to poor po intake currently. Note to have edema at the MCP and recommend a mission blue block for elevation and edema management. RN calling MD to request order. Ot to continue to monitor hand edema. Recommend ice applied to L UE by staff.   Follow Up Recommendations  Home health OT;Other (comment)    Equipment Recommendations  3 in 1 bedside commode    Recommendations for Other Services      Precautions / Restrictions Precautions Precautions: Fall Precaution Comments: sling  Required Braces or Orthoses: Sling Restrictions Weight Bearing Restrictions: Yes LUE Weight Bearing: Non weight bearing       Mobility Bed Mobility Overal bed mobility: Needs Assistance Bed Mobility: Sit to Supine     Supine to sit: Min assist Sit to supine: Mod assist   General bed mobility comments: educated daughter on use of sheet to help with posititioning at home to avoid pulling on patient. Educated on pillow positioning  Transfers Overall transfer level: Needs assistance Equipment used: 1 person hand held assist Transfers: Sit to/from Stand Sit to Stand: Mod assist         General transfer comment: daughter helping patient elevate from chair  surfaec    Balance Overall balance assessment: Needs assistance Sitting-balance support: Single extremity supported;Feet supported Sitting balance-Leahy Scale: Fair     Standing balance support: Single extremity supported;During functional activity Standing balance-Leahy Scale: Poor                             ADL either performed or assessed with clinical judgement   ADL Overall ADL's : Needs assistance/impaired                         Toilet Transfer: Moderate assistance Toilet Transfer Details (indicate cue type and reason): daughter (A) hand held and peri care  Toileting- Clothing Manipulation and Hygiene: Moderate assistance Toileting - Clothing Manipulation Details (indicate cue type and reason): pt holding bar with R UE so daughter can pull up brief       General ADL Comments: educated on edema management. ordered geo mat due to patients c/o pain at sacrum with redness noted. Pt with sacral pad present. pt with ace wrap loose again to help with edema. pt with ice applied. Asked RN to call for a mission blue block for patient to elevate L UE passively. The block may require trimming to help fit against patients body due to small body habitu     Vision       Perception     Praxis      Cognition Arousal/Alertness: Awake/alert Behavior During Therapy: WFL for tasks assessed/performed Overall Cognitive Status: Impaired/Different from baseline Area of Impairment:  Memory;Safety/judgement;Orientation                 Orientation Level: Disoriented to;Situation   Memory: Decreased recall of precautions;Decreased short-term memory   Safety/Judgement: Decreased awareness of safety;Decreased awareness of deficits     General Comments: daughter repeating statements to patient and reports that memory has not been good since pain medication started        Exercises Other Exercises Other Exercises: digit flexion and extension   Shoulder  Instructions       General Comments noted to have edema at the MCP splint in good placement and no break down noted.     Pertinent Vitals/ Pain       Pain Assessment: Faces Faces Pain Scale: Hurts even more Pain Location: L UE  Pain Descriptors / Indicators: Discomfort;Grimacing Pain Intervention(s): Monitored during session;Premedicated before session;Repositioned;Ice applied  Home Living                                          Prior Functioning/Environment              Frequency  Min 2X/week        Progress Toward Goals  OT Goals(current goals can now be found in the care plan section)  Progress towards OT goals: Progressing toward goals  Acute Rehab OT Goals Patient Stated Goal: wanting to get to the bathroom OT Goal Formulation: With patient/family Time For Goal Achievement: 04/30/16 Potential to Achieve Goals: Good ADL Goals Pt Will Transfer to Toilet: with min guard assist;bedside commode;ambulating Additional ADL Goal #1: Pt and daughter will don and doff sling mod i Additional ADL Goal #2: Pt and daughter will complete toilet transfer supervision Additional ADL Goal #3: Pt will demonstrate digit AROM mod I to decr edema  Plan Discharge plan remains appropriate    Co-evaluation                 End of Session Equipment Utilized During Treatment: Other (comment)  OT Visit Diagnosis: Pain Pain - Right/Left: Left Pain - part of body: Arm;Hand;Shoulder   Activity Tolerance Patient tolerated treatment well   Patient Left with call bell/phone within reach;with family/visitor present;in bed   Nurse Communication Mobility status;Precautions;Weight bearing status        Time: 1300 (1300)-1333 OT Time Calculation (min): 33 min  Charges: OT General Charges $OT Visit: 1 Procedure OT Treatments $Self Care/Home Management : 23-37 mins   Mateo Flow   OTR/L Pager: [phone] Office: [phone] .    Boone Master  B 04/17/2016, 2:33 PM

## 2016-04-17 NOTE — Progress Notes (Signed)
Pharmacy Antibiotic Note  [name] is a 83 y.o. female admitted on 04/13/2016 with open fracture.  Pharmacy has been consulted for vancomycin dosing. Pt is also on cefepime per MD. Pt is afebrile but WBC went up slightly today. Of note, pt is on prednisone which can influence the WBC.   Plan: Continue vanc  IV Q48H F/u renal fxn, C&S, clinical status and trough at [facility] Consider de-escalate    Temp (24hrs), Avg:98 F (36.7 C), Min:97.6 F (36.4 C), Max:98.7 F (37.1 C)   Recent Labs Lab 04/13/16 2226 04/14/16 0521 04/15/16 0536 04/16/16 0607 04/17/16 0337  WBC 12.7* 11.5* 13.3* 10.7* 12.0*  CREATININE 1.22* 1.22* 1.15* 1.10* 1.24*    Estimated Creatinine Clearance: 18.1 mL/min (A) (by C-G formula based on SCr of 1.24 mg/dL (H)).    Allergies  Allergen Reactions  . Iodinated Diagnostic Agents Rash  . Iohexol Rash  . Penicillins Rash  . Statins Other (See Comments)    myalgias    Thank you for allowing pharmacy to be a part of this patient's care.  Lysle Pearl, PharmD, BCPS Pager # [phone] 04/17/2016 12:18 PM

## 2016-04-17 NOTE — Progress Notes (Signed)
Physical Therapy Treatment Patient Details Name: [name] MRN: [id number] DOB: 1918-01-29 Today's Date: 04/17/2016    History of Present Illness 83 yo female s/p fall with Comminuted left wrist intra-articular distal radius fracture of 3 or more fragments, grade 2 open distal radius fracture, left proximal humerus 3 part fracture. s/p 4/1 L wrist brachial radialis tendon release, forearm flexor release, open treatmet of L wrist open distal fx with debridement of skin tissue and bone with excisional debridement, closed manipulation of L proximal humerus fx , biomet DVR cross lock narrow plate with several CC of biomet stay graft bone graft substitute.    PT Comments    Patient presented awake and alert, supine in bed, eager to move to get to the bathroom. Ms. Hebb presented cognitively intact, recalling details from her fall. Pt stated she would have tried to get to the bathroom independently before calling nurse, reinforcing need for 24 hour care. Pt is min assist in transfers and ambulation, and she feels more confident knowing there is someone close by for guarding. OT was notified regarding significant edema on dorsal aspect of hand surrounding cast. Ms. Huston is progressing towards goals of discharging home from a PT standpoint, if she is provided 24 hour care and home health PT.   Follow Up Recommendations        Equipment Recommendations  None recommended by PT    Recommendations for Other Services       Precautions / Restrictions Precautions Precautions: Fall Precaution Comments: sling  Required Braces or Orthoses: Sling Restrictions Weight Bearing Restrictions: Yes LUE Weight Bearing: Non weight bearing    Mobility  Bed Mobility Overal bed mobility: Needs Assistance Bed Mobility: Supine to Sit     Supine to sit: Min assist     General bed mobility comments: Cues for technique; able to use LE to shift body over; got up on left side of the bed for bathroom  purposes; use support to pull to sit  Transfers Overall transfer level: Needs assistance Equipment used: 1 person hand held assist Transfers: Sit to/from Stand Sit to Stand: Independent         General transfer comment: cues for safety   Ambulation/Gait Ambulation/Gait assistance: Min assist   Assistive device: 1 person hand held assist Gait Pattern/deviations: Step-through pattern;Narrow base of support     General Gait Details: Min assist for balance and support, noted she feels more stable knowing there is someone close by. Tried to use cane for ambulation, pt felt less comfortable and unstable. Decided she is better suited to ambulate without a cane.    Stairs            Wheelchair Mobility    Modified Rankin (Stroke Patients Only)       Balance Overall balance assessment: Needs assistance Sitting-balance support: Single extremity supported;Feet supported Sitting balance-Leahy Scale: Fair     Standing balance support: Single extremity supported;During functional activity Standing balance-Leahy Scale: Poor                              Cognition Arousal/Alertness: Awake/alert Behavior During Therapy: WFL for tasks assessed/performed Overall Cognitive Status: Within Functional Limits for tasks assessed                                 General Comments: Improved cognition from visit yesterday afternoon. Was able to recall  her fall, that she had a broken bone,  and she could not put weight through L UE. She also remembered information about her life (dog's name, type of dog, who she lives with) which she could not describe yesterday.       Exercises      General Comments        Pertinent Vitals/Pain Pain Assessment: Faces Faces Pain Scale: Hurts even more Pain Location: L UE  Pain Descriptors / Indicators: Discomfort;Grimacing Pain Intervention(s): Monitored during session;Ice applied    Home Living                       Prior Function            PT Goals (current goals can now be found in the care plan section) Acute Rehab PT Goals Patient Stated Goal: wanting to get to the bathroom    Frequency           PT Plan Current plan remains appropriate;Equipment recommendations need to be updated    Co-evaluation             End of Session Equipment Utilized During Treatment: Gait belt   Patient left: in chair;with chair alarm set;with call bell/phone within reach;with family/visitor present   Pain - Right/Left: Left Pain - part of body: Shoulder     Time: 9147-8295 PT Time Calculation (min) (ACUTE ONLY): 29 min  Charges:  $Gait Training: 8-22 mins $Therapeutic Activity: 8-22 mins                    G Codes:       Callie Fielding, SPT Acute Rehabilitation Services Office: [phone]  Callie Fielding 04/17/2016, 1:24 PM

## 2016-04-17 NOTE — Progress Notes (Signed)
Initial Nutrition Assessment  DOCUMENTATION CODES:   Non-severe (moderate) malnutrition in context of chronic illness  INTERVENTION:  Discontinue Ensure.   Provide Boost Breeze po BID, each supplement provides 250 kcal and 9 grams of protein.  Encourage adequate PO intake.   NUTRITION DIAGNOSIS:   Malnutrition related to chronic illness as evidenced by moderate depletion of body fat, severe depletion of muscle mass.  GOAL:   Patient will meet greater than or equal to 90% of their needs  MONITOR:   PO intake, Supplement acceptance, Labs, Weight trends, Skin, I & O's  REASON FOR ASSESSMENT:   Malnutrition Screening Tool    ASSESSMENT:   83 y.o. female with medical history significant of takosubo cardiomyopathy with EF of 50%, Osteoporosis, osteoarthritis, PMR on chronic steroids, HTN who presents after a fall associated with wrist and humerus fractures on the left. s/p 4/1 L wrist brachial radialis tendon release, forearm flexor release, open treatmet of L wrist open distal fx with debridement of skin tissue and bone with excisional debridement, closed manipulation of L proximal humerus fx , biomet DVR cross lock narrow plate with several CC of biomet stay graft bone graft substitute.  Meal completion has been 10-75% with 75% at lunch today. Pt reports appetite is just "ok". Family at bedside reports pt usually experiences early satiety, thus family has been providing frequent small meals and snacks throughout the day. Usual body weight unknown. Ensure/Boost has been bought for home consumption, however pt reports it causes abdominal discomfort. Noted Ensure has been ordered for this admission. RD to discontinue. Pt is agreeable to alternative supplements. RD to order [facility]. Pt encouraged to eat at meals. Educated on adequate protein intake for wound healing, pt and family expressed understanding.   Nutrition-Focused physical exam completed. Findings are moderate fat  depletion, severe muscle depletion, and moderate edema.   Labs and medications reviewed.   Diet Order:  Diet regular Room service appropriate? Yes; Fluid consistency: Thin  Skin:   (Incision on L arm)  Last BM:  4/3  Height:   Ht Readings from Last 1 Encounters:  04/12/16 5' (1.524 m)    Weight:   Wt Readings from Last 1 Encounters:  04/12/16 93 lb (42.2 kg)    Ideal Body Weight:  45.45 kg  BMI:  There is no height or weight on file to calculate BMI.  Estimated Nutritional Needs:   Kcal:  1250-1500  Protein:  50-60 grams  Fluid:  >/= 1.5 L/day  EDUCATION NEEDS:   Education needs addressed  [name], MS, RD, LDN Pager # [phone] After hours/ weekend pager # [phone]

## 2016-04-17 NOTE — Progress Notes (Signed)
Patient ID: [name], female   DOB: [birth year], 83 y.o.   MRN: [id number]    PROGRESS NOTE  [name]  ION:[id number] DOB: 05-23-1918 DOA: 04/13/2016  PCP: Pcp Not In System   Brief Narrative:  Pt admitted for further evaluation and management for new left distal radius and proximal humerus fracture that she has sustained after an episode of fall (fell on an outstretched left upper extremity).   Assessment & Plan:   Active Problems: Comminuted left wrist intra-articular distal radius fracture of 3 or more fragments, grade 2 open distal radius fracture, left proximal humerus 3 part fracture - pt is now s/p following: post op day #3 1. Open treatment of left wrist intra-articular distal radius fracture 3 or more fragments. 2. Left wrist brachial radialis tendon release, forearm flexor release. 3. Open treatment of left wrist injury intra-articular open distal radius fracture with debridement of skin subQ tissue and bone, excisional debridement. 4. Traumatic wound laceration 7-1/2 cm repair, complex wound closure 5. Close manipulation of left proximal humerus fracture requiring manipulation and anesthesia - per ortho, pt to remain nonweightbearing in the left upper extremity - continue with the long-arm splint for a total of 2 weeks - continue with IV ABX for now as pt with WBC trending up, fortunately no fevers overnight  - follow-up in Dr. Glenna Durand office in approximately 2 weeks for wound check. - short arm cast immobilization for an additional 3 weeks, we'll then begin therapy regimen at that time - continue with the sling until decision is made about further intervention for the shoulder.  - currently on Vanc and Cefepime, today is day #4, plan to d/c tomorrow if WBC back to normal limits  - CBC In AM   Acute kidney injury - pre renal - Cr slightly up this AM and possibly from poor oral intake  - BMP In AM  Post op blood loss anemia - drop in Hg since admission,  post op related and possible dilutional component - no indication for transfusion at this time, Hg stable this AM  - CBC in AM  Thrombocytopenia - mild, reactive but overall stable and WNL this AM  - CBC in AM  HTN essential - reasonable control given pain from acute illness - continue Lisinopril and Metoprolol, lasix    HLD - continue statin   DVT prophylaxis: SCD's Code Status: Full  Family Communication: Patient at bedside, left message for pt's daughter on her cell phone and to call me back at her convenience  Disposition Plan: pt will be going home once fever resolved and once able to take off IV ABX  Consultants:   Ortho   Procedures: done by Dr. Melvyn Novas in 4/1   Open treatment of left wrist intra-articular distal radius fracture 3 or more fragments.  Left wrist brachial radialis tendon release, forearm flexor release.  Open treatment of left wrist injury intra-articular open distal radius fracture with debridement of skin subQ tissue and bone, excisional debridement.  Traumatic wound laceration 7-1/2 cm repair, complex wound closure  Close manipulation of left proximal humerus fracture requiring manipulation and anesthesia  Antimicrobials:   Vancomycin 4/1 -->  Cefepime 4/2 -->   Subjective: Pt reports ongoing pain in the left wrist, 4/10 in severity.   Objective: Vitals:   04/16/16 0624 04/16/16 1346 04/16/16 2121 04/17/16 0419  BP: 137/64 128/71 (!) 156/60 (!) 145/79  Pulse: 72 73 75 79  Resp:  Temp: (!) 100.5 F (38.1  C) 97.8 F (36.6 C) 98.7 F (37.1 C) 97.6 F (36.4 C)  TempSrc: Oral Axillary Oral Oral  SpO2: 93% 96% 97% 97%    Intake/Output Summary (Last 24 hours) at 04/17/16 1154 Last data filed at 04/17/16 1015  Gross per 24 hour  Intake              870 ml  Output                0 ml  Net              870 ml   There were no vitals filed for this visit.  Examination:  General exam: Appears calm and comfortable, pleasant  but says she is still in pain  Respiratory system: Clear to auscultation. Respiratory effort normal. Cardiovascular system: S1 & S2 heard, RRR. No JVD, rubs, gallops or clicks.  Gastrointestinal system: Abdomen is nondistended, soft and nontender. No organomegaly or masses felt. Central nervous system: Alert and oriented. No focal neurological deficits. Extremities: left wrist, arm still significant TTP but overall better, in splint   Data Reviewed: I have personally reviewed following labs and imaging studies  CBC:  Recent Labs Lab 04/12/16 1918 04/13/16 2226 04/14/16 0521 04/15/16 0536 04/16/16 0607 04/17/16 0337  WBC 7.7 12.7* 11.5* 13.3* 10.7* 12.0*  NEUTROABS 5.4 10.1*  --   --   --   --   HGB 13.3 12.0 11.0* 9.8* 9.4* 10.6*  HCT 40.6 37.6 34.4* 30.1* 28.4* 32.6*  MCV 96.0 97.7 96.9 95.6 95.6 95.0  PLT 176 157 153 131* 134* 190   Basic Metabolic Panel:  Recent Labs Lab 04/13/16 2226 04/14/16 0521 04/15/16 0536 04/16/16 0607 04/17/16 0337  NA 140 140 137 140 140  K 4.4 4.2 4.2 4.0 4.7  CL 107 104 103 103 98*  CO2 34*  GLUCOSE 125* 140* 120* 92 112*  BUN CREATININE 1.22* 1.22* 1.15* 1.10* 1.24*  CALCIUM 9.0 9.4 9.0 9.4 9.8   Coagulation Profile:  Recent Labs Lab 04/13/16 2226  INR 0.94   CBG:  Recent Labs Lab 04/12/16 1900 04/14/16 0608 04/17/16 0628  GLUCAP 113* 129* 94   Urine analysis:    Component Value Date/Time   COLORURINE YELLOW 04/12/2016 1857   APPEARANCEUR CLEAR 04/12/2016 1857   LABSPEC 1.006 04/12/2016 1857   PHURINE 5.5 04/12/2016 1857   GLUCOSEU NEGATIVE 04/12/2016 1857   HGBUR NEGATIVE 04/12/2016 1857   BILIRUBINUR NEGATIVE 04/12/2016 1857   KETONESUR NEGATIVE 04/12/2016 1857   PROTEINUR NEGATIVE 04/12/2016 1857   UROBILINOGEN 1.0 03/01/2010 1654   NITRITE NEGATIVE 04/12/2016 1857   LEUKOCYTESUR NEGATIVE 04/12/2016 1857   Recent Results (from the past 240 hour(s))  Urine culture     Status:  None   Collection Time: 04/12/16  6:57 PM  Result Value Ref Range Status   Specimen Description URINE, RANDOM  Final   Special Requests NONE  Final   Culture   Final    NO GROWTH Performed at [facility], [address], [place], [place] [address]    Report Status 04/14/2016 FINAL  Final  Surgical pcr screen     Status: None   Collection Time: 04/14/16  4:31 AM  Result Value Ref Range Status   MRSA, PCR NEGATIVE NEGATIVE Final   Staphylococcus aureus NEGATIVE NEGATIVE Final    Radiology Studies: No results found. Scheduled Meds: . ceFEPime (MAXIPIME) IV  1 g Intravenous Q24H  .  feeding supplement (ENSURE ENLIVE)  237 mL Oral BID BM  . furosemide  20 mg Oral QHS  . lisinopril  5 mg Oral Daily  . loratadine  10 mg Oral Daily  . metoprolol succinate  25 mg Oral Daily  . multivitamin with minerals  1 tablet Oral Daily  . pantoprazole  40 mg Oral Daily  . predniSONE  5 mg Oral Daily  . simvastatin  40 mg Oral q1800  . vancomycin  500 mg Intravenous Q48H   Continuous Infusions:   LOS: 4 days   Time spent: 20 minutes   Debbora Presto, MD [facility] Pager [phone]  If 7PM-7AM, please contact night-coverage [email or web address] Password [email or web address] 04/17/2016, 11:54 AM

## 2016-04-17 NOTE — Progress Notes (Signed)
Orthopedic Tech Progress Note Patient Details:  [name] 01-06-1919 [id number]  Ortho Devices Ortho Device/Splint Location: carter arm pillow Ortho Device/Splint Interventions: Ordered, Application   Jennye Moccasin 04/17/2016, 6:58 PM

## 2016-04-17 NOTE — Care Management Note (Signed)
Case Management Note  Patient Details  Name: [name] MRN: [id number] Date of Birth: 1919-01-02  Subjective/Objective:    83 yr old female admitted with a left wrist fracture.              Action/Plan: Case manager spoke with patient and her daughter, Malachi Bonds concerning discharge plan. Malachi Bonds has stated that they want patient to go home rather than SNF. Patient has periods of confusion and doesn't follow conversation long. CM provided patient with private duty list for help that she needs beyond what Home Health agency will provide. CM called referral to karen, [facility] Liaison and was informed that at the present time they cannot accept patient's with [facility] medicare coverage. CM called referral to Cleda Clarks, [facility] Liaison, and they will accept patient.    Expected Discharge Date:    04/17/16              Expected Discharge Plan:  Home w Home Health Services  In-House Referral:  NA  Discharge planning Services  CM Consult  Post Acute Care Choice:  Durable Medical Equipment, Home Health Choice offered to:  Adult Children, Patient  DME Arranged:  3-N-1 DME Agency:  [facility]  HH Arranged:  PT, OT, Nurse's Aide HH Agency:   [facility] Status of Service:  Completed, signed off  If discussed at Long Length of Stay Meetings, dates discussed:    Additional Comments:  Durenda Guthrie, RN 04/17/2016, 9:59 AM

## 2016-04-17 NOTE — Care Management Important Message (Signed)
Important Message  Patient Details  Name: [name] MRN: [id number] Date of Birth: [birth year]   Medicare Important Message Given:  Yes    Briel Gallicchio Stefan Church 04/17/2016, 12:23 PM

## 2016-04-18 DIAGNOSIS — E440 Moderate protein-calorie malnutrition: Secondary | ICD-10-CM | POA: Diagnosis present

## 2016-04-18 LAB — CBC
HCT: 33.6 % — ABNORMAL LOW (ref 36.0–46.0)
Hemoglobin: 10.9 g/dL — ABNORMAL LOW (ref 12.0–15.0)
MCH: 30.6 pg (ref 26.0–34.0)
MCHC: 32.4 g/dL (ref 30.0–36.0)
MCV: 94.4 fL (ref 78.0–100.0)
PLATELETS: 266 10*3/uL (ref 150–400)
RBC: 3.56 MIL/uL — ABNORMAL LOW (ref 3.87–5.11)
RDW: 13.1 % (ref 11.5–15.5)
WBC: 10.8 10*3/uL — AB (ref 4.0–10.5)

## 2016-04-18 LAB — GLUCOSE, CAPILLARY: GLUCOSE-CAPILLARY: 110 mg/dL — AB (ref 65–99)

## 2016-04-18 LAB — BASIC METABOLIC PANEL
Anion gap: 10 (ref 5–15)
BUN: 18 mg/dL (ref 6–20)
CALCIUM: 9.3 mg/dL (ref 8.9–10.3)
CO2: 35 mmol/L — ABNORMAL HIGH (ref 22–32)
Chloride: 94 mmol/L — ABNORMAL LOW (ref 101–111)
Creatinine, Ser: 1.13 mg/dL — ABNORMAL HIGH (ref 0.44–1.00)
GFR, EST AFRICAN AMERICAN: 46 mL/min — AB (ref >60)
GFR, EST NON AFRICAN AMERICAN: 40 mL/min — AB (ref >60)
Glucose, Bld: 110 mg/dL — ABNORMAL HIGH (ref 65–99)
Potassium: 3.1 mmol/L — ABNORMAL LOW (ref 3.5–5.1)
SODIUM: 139 mmol/L (ref 135–145)

## 2016-04-18 MED ORDER — MORPHINE SULFATE (PF) 2 MG/ML IV SOLN: 2.0000 mg | Freq: Once | INTRAVENOUS | Status: DC

## 2016-04-18 MED ORDER — POTASSIUM CHLORIDE CRYS ER 20 MEQ PO TBCR
40.0000 meq | EXTENDED_RELEASE_TABLET | Freq: Once | ORAL | Status: AC
  Administered 2016-04-18: 40 meq via ORAL
  Filled 2016-04-18: qty 2

## 2016-04-18 MED ORDER — HYDROCODONE-ACETAMINOPHEN 5-325 MG PO TABS
1.0000 | ORAL_TABLET | ORAL | Status: DC | PRN
  Administered 2016-04-18 – 2016-04-21 (×12): 1 via ORAL
  Filled 2016-04-18 (×13): qty 1

## 2016-04-18 NOTE — Progress Notes (Addendum)
Notified MD of pain in left arm.  Orders received. Ice applied, and arm is elevated.

## 2016-04-18 NOTE — Progress Notes (Signed)
Occupational Therapy Treatment Patient Details Name: [name] MRN: [id number] DOB: 1918-10-28 Today's Date: 04/18/2016    History of present illness 83 yo female s/p fall with Comminuted left wrist intra-articular distal radius fracture of 3 or more fragments, grade 2 open distal radius fracture, left proximal humerus 3 part fracture. s/p 4/1 L wrist brachial radialis tendon release, forearm flexor release, open treatmet of L wrist open distal fx with debridement of skin tissue and bone with excisional debridement, closed manipulation of L proximal humerus fx , biomet DVR cross lock narrow plate with several CC of biomet stay graft bone graft substitute.   OT comments  Splint checked.  Pressure and pain noted dorsum of hand with mod edema.  Gel added to dorsal aspect. PROM, AAROM performed of hand. Lt UE elevated and retrograde massage performed - daughter present and updated. Will continue to follow   Follow Up Recommendations  Home health OT;Other (comment)    Equipment Recommendations  3 in 1 bedside commode    Recommendations for Other Services      Precautions / Restrictions Precautions Precautions: Fall Precaution Comments: sling  Restrictions Weight Bearing Restrictions: Yes LUE Weight Bearing: Non weight bearing       Mobility Bed Mobility                  Transfers                      Balance                                           ADL either performed or assessed with clinical judgement   ADL                                               Vision       Perception     Praxis      Cognition Arousal/Alertness: Awake/alert Behavior During Therapy: WFL for tasks assessed/performed Overall Cognitive Status: Impaired/Different from baseline Area of Impairment: Memory;Safety/judgement                     Memory: Decreased recall of precautions;Decreased short-term memory    Safety/Judgement: Decreased awareness of safety;Decreased awareness of deficits              Exercises Other Exercises Other Exercises: splint checked.  Pt complaining about pain dorsum of hand. Padding that was previously placed not sufficient in padding the jagged edges of the fiberglass splint.  Gel added to dorsal aspect with reduction in pain.  mod Edema noted, but better than 04/16/16.   Retrograde massage performed dorsum of hand followed by AROM, PROM of digits - Passive stretch to MCPs performed.  Lt UE elevated on foam block    Shoulder Instructions       General Comments      Pertinent Vitals/ Pain       Pain Assessment: Faces Faces Pain Scale: Hurts little more Pain Location: L UE  Pain Descriptors / Indicators: Aching;Grimacing;Discomfort Pain Intervention(s): Monitored during session;Repositioned  Home Living  Prior Functioning/Environment              Frequency  Min 2X/week        Progress Toward Goals  OT Goals(current goals can now be found in the care plan section)  Progress towards OT goals: Progressing toward goals     Plan Discharge plan remains appropriate    Co-evaluation                 End of Session Equipment Utilized During Treatment: Other (comment) (splint/sling )  OT Visit Diagnosis: Pain Pain - Right/Left: Left Pain - part of body: Arm;Hand;Shoulder   Activity Tolerance Patient tolerated treatment well   Patient Left in bed;with call bell/phone within reach   Nurse Communication          Time: 2956-2130 OT Time Calculation (min): 42 min  Charges: OT General Charges $OT Visit: 1 Procedure OT Treatments $Orthotics/Prosthetics Check: 38-52 mins  [employer], OTR/L [phone]    Jeani Hawking M 04/18/2016, 4:03 PM

## 2016-04-18 NOTE — Progress Notes (Addendum)
Patient ID: [name], female   DOB: Nov 06, 1918, 83 y.o.   MRN: [id number]    PROGRESS NOTE  [name]  NWG:[id number] DOB: 06/03/1918 DOA: 04/13/2016  PCP: Pcp Not In System   Brief Narrative:  Pt admitted for further evaluation and management for new left distal radius and proximal humerus fracture that she has sustained after an episode of fall (fell on an outstretched left upper extremity).   Assessment & Plan:   Active Problems: Comminuted left wrist intra-articular distal radius fracture of 3 or more fragments, grade 2 open distal radius fracture, left proximal humerus 3 part fracture - pt is now s/p following: post op day #4 1. Open treatment of left wrist intra-articular distal radius fracture 3 or more fragments. 2. Left wrist brachial radialis tendon release, forearm flexor release. 3. Open treatment of left wrist injury intra-articular open distal radius fracture with debridement of skin subQ tissue and bone, excisional debridement. 4. Traumatic wound laceration 7-1/2 cm repair, complex wound closure 5. Close manipulation of left proximal humerus fracture requiring manipulation and anesthesia - per ortho, pt to remain nonweightbearing in the left upper extremity - continue with the long-arm splint for a total of 2 weeks - continue with IV ABX Cefepime for one more day as pt still with leukocytosis and hand still somewhat red and swollen, stop Vancomycin today  - follow-up in Dr. Glenna Durand office in approximately 2 weeks for wound check. - short arm cast immobilization for an additional 3 weeks, we'll then begin therapy regimen at that time - continue with the sling until decision is made about further intervention for the shoulder.  - currently on Vanc and Cefepime, today is day #5, plan to d/c Cefepime tomorrow if WBC back to normal limits  - d/c vanc today - CBC In AM   Acute kidney injury - pre renal - pt eating better, Cr is down a little bit  - BMP In  AM  Post op blood loss anemia - drop in Hg since admission, post op related and possible dilutional component - no indication for transfusion at this time, Hg stable this AM  - CBC in AM  Hypokalemia - supplement and repeat BMP in AM  Thrombocytopenia - mild, reactive but overall stable and WNL this AM  - CBC in AM  HTN essential - reasonable control given pain from acute illness - continue Lisinopril and Metoprolol, lasix    HLD - continue statin   DVT prophylaxis: SCD's Code Status: Full  Family Communication: Patient at bedside, left message for pt's daughter again (second time) on her cell phone and to call me back at her convenience. I also left my card with pt and with my cell phone number to be contacted for an update.  Disposition Plan: home likely in AM   Consultants:   Ortho   Procedures: done by Dr. Melvyn Novas in 4/1   Open treatment of left wrist intra-articular distal radius fracture 3 or more fragments.  Left wrist brachial radialis tendon release, forearm flexor release.  Open treatment of left wrist injury intra-articular open distal radius fracture with debridement of skin subQ tissue and bone, excisional debridement.  Traumatic wound laceration 7-1/2 cm repair, complex wound closure  Close manipulation of left proximal humerus fracture requiring manipulation and anesthesia  Antimicrobials:   Vancomycin 4/1 --> 4/5   Cefepime 4/2 -->   Subjective: Pt reports ongoing pain in the left wrist, 3/10 in severity.   Objective: Vitals:   04/17/16  0419 04/17/16 1400 04/17/16 2049 04/18/16 0500  BP: (!) 145/79 118/62 (!) 112/55 120/64  Pulse: 79 77 70 75  Resp: Temp: 97.6 F (36.4 C) 98.1 F (36.7 C) 98.6 F (37 C) 98.6 F (37 C)  TempSrc: Oral Oral Oral Oral  SpO2: 97% 93% 95% 95%    Intake/Output Summary (Last 24 hours) at 04/18/16 0913 Last data filed at 04/17/16 1730  Gross per 24 hour  Intake              770 ml  Output                 0 ml  Net              770 ml   There were no vitals filed for this visit.  Examination:  General exam: Appears calm and comfortable, pleasant but says she is still in pain  Respiratory system: Clear to auscultation. Respiratory effort normal. Cardiovascular system: S1 & S2 heard, RRR. No JVD, rubs, gallops or clicks.  Gastrointestinal system: Abdomen is nondistended, soft and nontender. No organomegaly or masses felt. Central nervous system: Alert and oriented. No focal neurological deficits. Extremities: left wrist, arm still significant TTP and swollen, bit red, seems better overall   Data Reviewed: I have personally reviewed following labs and imaging studies  CBC:  Recent Labs Lab 04/12/16 1918 04/13/16 2226 04/14/16 0521 04/15/16 0536 04/16/16 0607 04/17/16 0337 04/18/16 0602  WBC 7.7 12.7* 11.5* 13.3* 10.7* 12.0* 10.8*  NEUTROABS 5.4 10.1*  --   --   --   --   --   HGB 13.3 12.0 11.0* 9.8* 9.4* 10.6* 10.9*  HCT 40.6 37.6 34.4* 30.1* 28.4* 32.6* 33.6*  MCV 96.0 97.7 96.9 95.6 95.6 95.0 94.4  PLT 176 157 153 131* 134* 190 266   Basic Metabolic Panel:  Recent Labs Lab 04/14/16 0521 04/15/16 0536 04/16/16 0607 04/17/16 0337 04/18/16 0602  NA 140 137 140 140 139  K 4.2 4.2 4.0 4.7 3.1*  CL 104 103 103 98* 94*  CO2 34* 35*  GLUCOSE 140* 120* 92 112* 110*  BUN CREATININE 1.22* 1.15* 1.10* 1.24* 1.13*  CALCIUM 9.4 9.0 9.4 9.8 9.3   Coagulation Profile:  Recent Labs Lab 04/13/16 2226  INR 0.94   CBG:  Recent Labs Lab 04/12/16 1900 04/14/16 0608 04/17/16 0628 04/17/16 1116 04/18/16 0629  GLUCAP 113* 129* 94 124* 110*   Urine analysis:    Component Value Date/Time   COLORURINE YELLOW 04/12/2016 1857   APPEARANCEUR CLEAR 04/12/2016 1857   LABSPEC 1.006 04/12/2016 1857   PHURINE 5.5 04/12/2016 1857   GLUCOSEU NEGATIVE 04/12/2016 1857   HGBUR NEGATIVE 04/12/2016 1857   BILIRUBINUR NEGATIVE 04/12/2016 1857    KETONESUR NEGATIVE 04/12/2016 1857   PROTEINUR NEGATIVE 04/12/2016 1857   UROBILINOGEN 1.0 03/01/2010 1654   NITRITE NEGATIVE 04/12/2016 1857   LEUKOCYTESUR NEGATIVE 04/12/2016 1857   Recent Results (from the past 240 hour(s))  Urine culture     Status: None   Collection Time: 04/12/16  6:57 PM  Result Value Ref Range Status   Specimen Description URINE, RANDOM  Final   Special Requests NONE  Final   Culture   Final    NO GROWTH Performed at [facility], [address] [place], [place] [address]    Report Status 04/14/2016 FINAL  Final  Surgical pcr screen  Status: None   Collection Time: 04/14/16  4:31 AM  Result Value Ref Range Status   MRSA, PCR NEGATIVE NEGATIVE Final   Staphylococcus aureus NEGATIVE NEGATIVE Final    Radiology Studies: No results found. Scheduled Meds: . ceFEPime (MAXIPIME) IV  1 g Intravenous Q24H  . feeding supplement  1 Container Oral BID BM  . furosemide  20 mg Oral QHS  . lisinopril  5 mg Oral Daily  . loratadine  10 mg Oral Daily  . metoprolol succinate  25 mg Oral Daily  . multivitamin with minerals  1 tablet Oral Daily  . pantoprazole  40 mg Oral Daily  . potassium chloride  40 mEq Oral Once  . predniSONE  5 mg Oral Daily  . simvastatin  40 mg Oral q1800  . vancomycin  500 mg Intravenous Q48H   Continuous Infusions:   LOS: 5 days   Time spent: 20 minutes   Debbora Presto, MD [facility] Pager [phone]  If 7PM-7AM, please contact night-coverage [email or web address] Password [facility] 04/18/2016, 9:13 AM

## 2016-04-18 NOTE — Progress Notes (Signed)
qPhysical Therapy Treatment Patient Details Name: [name] MRN: [id number] DOB: 09-15-1918 Today's Date: 04/18/2016    History of Present Illness 83 yo female s/p fall with Comminuted left wrist intra-articular distal radius fracture of 3 or more fragments, grade 2 open distal radius fracture, left proximal humerus 3 part fracture. s/p 4/1 L wrist brachial radialis tendon release, forearm flexor release, open treatmet of L wrist open distal fx with debridement of skin tissue and bone with excisional debridement, closed manipulation of L proximal humerus fx , biomet DVR cross lock narrow plate with several CC of biomet stay graft bone graft substitute.    PT Comments    Pt presented awake and alert, supine in bed in discomfort from arm positioning. [name] presented cognitively intact recalling her fall, but needed reminders for NWB restrictions. Pt described she felt more weaker today, but willing to participate in ambulation. [name] required mod assist in transfers today, attributable to her increased fatigue she was experiencing. Pt remained min assist in ambulation, guarding for stability and increased confidence for her. At the end of the session it was difficult to find a comfortable resting position of the L UE, keeping the hand elevated to decrease edema. OT was notified to address arm positioning (and potential use of mission block) during their session. [name] is progressing towards her goals of discharging to home from a PT standpoint if she has 24 hour care, home health PT and OT, and aide upon discharge.   Follow Up Recommendations         Equipment Recommendations  None recommended by PT    Recommendations for Other Services       Precautions / Restrictions Precautions Precautions: Fall Precaution Comments: sling  Required Braces or Orthoses: Sling Restrictions Weight Bearing Restrictions: Yes LUE Weight Bearing: Non weight bearing    Mobility  Bed  Mobility Overal bed mobility: Needs Assistance Bed Mobility: Supine to Sit     Supine to sit: Mod assist     General bed mobility comments: Cues for technique and reinforced NWB restriction on L UE; required mod assistance for pull to sit (provided through hand hold); educated [name] on importance of getting out of bed on unaffected side to reinforce NWB precaution  Transfers Overall transfer level: Needs assistance Equipment used: 1 person hand held assist Transfers: Sit to/from Stand Sit to Stand: Mod assist         General transfer comment: Cues for safety, hand held for patient to pull up   Ambulation/Gait Ambulation/Gait assistance: Min assist   Assistive device: 1 person hand held assist Gait Pattern/deviations: Step-through pattern;Narrow base of support     General Gait Details: Min assist for balance and support, noted she feels more stable knowing there is someone close by   [employer] Mobility    Modified Rankin (Stroke Patients Only)       Balance Overall balance assessment: Needs assistance           Standing balance-Leahy Scale: Poor                              Cognition Arousal/Alertness: Awake/alert Behavior During Therapy: WFL for tasks assessed/performed Overall Cognitive Status: Impaired/Different from baseline Area of Impairment: Memory;Safety/judgement                     Memory: Decreased recall  of precautions;Decreased short-term memory   Safety/Judgement: Decreased awareness of safety;Decreased awareness of deficits     General Comments: Repeated more than once in the session pt stated she fell and hurt herself; could not recall NWB restriction for L UE; able to recall information about her life (including children, pet and describe her home)      Exercises      General Comments        Pertinent Vitals/Pain Pain Assessment: Faces Faces Pain Scale: Hurts whole lot Pain  Location: L UE  Pain Descriptors / Indicators: Discomfort;Grimacing (Ringing in ears pt attributed to pain) Pain Intervention(s): Monitored during session;RN gave pain meds during session    Home Living                      Prior Function            PT Goals (current goals can now be found in the care plan section)      Frequency           PT Plan Current plan remains appropriate    Co-evaluation             End of Session Equipment Utilized During Treatment: Gait belt   Patient left: in chair;with chair alarm set;with call bell/phone within reach (of [name]);with family/visitor present; with ice Nurse Communication: Other (comment) (concerns pt/family had regarding overnight care and pain) Pain - Right/Left: Left Pain - part of body: Shoulder     Time: 2536-6440 PT Time Calculation (min) (ACUTE ONLY): 40 min  Charges:   2 Gait Training 1 Therapeutic Activity                    G Codes:       Callie Fielding, SPT Acute Rehabilitation Services Office: [phone]    Callie Fielding 04/18/2016, 12:14 PM

## 2016-04-19 ENCOUNTER — Other Ambulatory Visit: Payer: Self-pay

## 2016-04-19 DIAGNOSIS — R55 Syncope and collapse: Secondary | ICD-10-CM

## 2016-04-19 DIAGNOSIS — S62102A Fracture of unspecified carpal bone, left wrist, initial encounter for closed fracture: Secondary | ICD-10-CM

## 2016-04-19 DIAGNOSIS — S62109A Fracture of unspecified carpal bone, unspecified wrist, initial encounter for closed fracture: Secondary | ICD-10-CM

## 2016-04-19 LAB — BASIC METABOLIC PANEL
ANION GAP: 11 (ref 5–15)
BUN: 21 mg/dL — ABNORMAL HIGH (ref 6–20)
CALCIUM: 9.2 mg/dL (ref 8.9–10.3)
CO2: 33 mmol/L — ABNORMAL HIGH (ref 22–32)
CREATININE: 1.27 mg/dL — AB (ref 0.44–1.00)
Chloride: 96 mmol/L — ABNORMAL LOW (ref 101–111)
GFR calc non Af Amer: 35 mL/min — ABNORMAL LOW (ref >60)
GFR, EST AFRICAN AMERICAN: 40 mL/min — AB (ref >60)
Glucose, Bld: 105 mg/dL — ABNORMAL HIGH (ref 65–99)
Potassium: 3.7 mmol/L (ref 3.5–5.1)
SODIUM: 140 mmol/L (ref 135–145)

## 2016-04-19 LAB — TROPONIN I: Troponin I: <0.03 ng/mL (ref <0.03)

## 2016-04-19 LAB — CBC
HCT: 31.6 % — ABNORMAL LOW (ref 36.0–46.0)
HEMOGLOBIN: 10.6 g/dL — AB (ref 12.0–15.0)
MCH: 32.2 pg (ref 26.0–34.0)
MCHC: 33.5 g/dL (ref 30.0–36.0)
MCV: 96 fL (ref 78.0–100.0)
Platelets: 273 10*3/uL (ref 150–400)
RBC: 3.29 MIL/uL — ABNORMAL LOW (ref 3.87–5.11)
RDW: 13.5 % (ref 11.5–15.5)
WBC: 9.1 10*3/uL (ref 4.0–10.5)

## 2016-04-19 LAB — GLUCOSE, CAPILLARY: GLUCOSE-CAPILLARY: 101 mg/dL — AB (ref 65–99)

## 2016-04-19 MED ORDER — POLYETHYLENE GLYCOL 3350 17 G PO PACK
17.0000 g | PACK | Freq: Every day | ORAL | Status: DC | PRN
  Filled 2016-04-19: qty 1

## 2016-04-19 MED ORDER — SODIUM CHLORIDE 0.9 % IV BOLUS (SEPSIS)
500.0000 mL | Freq: Once | INTRAVENOUS | Status: AC
  Administered 2016-04-19: 500 mL via INTRAVENOUS

## 2016-04-19 MED ORDER — SODIUM CHLORIDE 0.9 % IV SOLN
INTRAVENOUS | Status: AC
  Administered 2016-04-19 (×2): via INTRAVENOUS

## 2016-04-19 NOTE — Progress Notes (Signed)
qPhysical Therapy Treatment Patient Details Name: [name] MRN: [id number] DOB: 1918/08/18 Today's Date: 04/19/2016    History of Present Illness 83 yo female s/p fall with Comminuted left wrist intra-articular distal radius fracture of 3 or more fragments, grade 2 open distal radius fracture, left proximal humerus 3 part fracture. s/p 4/1 L wrist brachial radialis tendon release, forearm flexor release, open treatmet of L wrist open distal fx with debridement of skin tissue and bone with excisional debridement, closed manipulation of L proximal humerus fx , biomet DVR cross lock narrow plate with several CC of biomet stay graft bone graft substitute.    PT Comments    Patient required min/mod A for transfers and ambulation this session with LOB X1 requiring mod A to recover. Pt very emotional and crying at times during session. Pt also kept looking for her daughter and unable to state place or situation. Pt will need 24 hour supervision/assist due to pt's balance deficits and cognition. Continue to progress as tolerated.     Follow Up Recommendations  Home health PT;Supervision/Assistance - 24 hour;Other (comment)     Equipment Recommendations  None recommended by PT    Recommendations for Other Services       Precautions / Restrictions Precautions Precautions: Fall Required Braces or Orthoses: Sling Restrictions Weight Bearing Restrictions: Yes LUE Weight Bearing: Non weight bearing    Mobility  Bed Mobility               General bed mobility comments: in chair on arrival  Transfers Overall transfer level: Needs assistance Equipment used: 1 person hand held assist Transfers: Sit to/from Stand Sit to Stand: Min assist         General transfer comment: assist to stabilize upon standing   Ambulation/Gait Ambulation/Gait assistance: Min assist;Mod assist Ambulation Distance (Feet): 80 Feet Assistive device: 1 person hand held assist Gait  Pattern/deviations: Step-through pattern;Decreased stride length;Narrow base of support     General Gait Details: min A with initial ~90ft and mod A required when going back toward room due to increased unsteadiness; LOB X1 with mod A to recover just after direction change   Stairs            Wheelchair Mobility    Modified Rankin (Stroke Patients Only)       Balance Overall balance assessment: Needs assistance   Sitting balance-Leahy Scale: Fair     Standing balance support: Single extremity supported Standing balance-Leahy Scale: Poor                              Cognition Arousal/Alertness: Awake/alert Behavior During Therapy: Restless (emotional with crying at times) Overall Cognitive Status: Impaired/Different from baseline Area of Impairment: Memory;Safety/judgement;Problem solving;Orientation                 Orientation Level: Disoriented to;Place;Situation;Time   Memory: Decreased recall of precautions;Decreased short-term memory   Safety/Judgement: Decreased awareness of safety;Decreased awareness of deficits   Problem Solving: Slow processing;Requires verbal cues General Comments: pt unable to state where she is or why she came here without cues but able to report she lives with daughter and son and that she has a pet; pt repeated same phrases throughout session "I have a $100 I saved from my birthday if my daughter needs it", "I saved this brownie for my daughter, can you go get her?", and asked employees in the hallway if they were her daughter  Exercises      General Comments        Pertinent Vitals/Pain Pain Assessment: Faces Faces Pain Scale: Hurts little more Pain Location: L UE with movement Pain Descriptors / Indicators: Aching;Grimacing;Guarding Pain Intervention(s): Limited activity within patient's tolerance;Monitored during session;Premedicated before session;Repositioned    Home Living                       Prior Function            PT Goals (current goals can now be found in the care plan section) Progress towards PT goals: Progressing toward goals    Frequency    Min 6X/week      PT Plan Current plan remains appropriate    Co-evaluation             End of Session Equipment Utilized During Treatment: Gait belt Activity Tolerance: Patient tolerated treatment well Patient left: in chair;with chair alarm set;with call bell/phone within reach Nurse Communication: Mobility status PT Visit Diagnosis: Unsteadiness on feet (R26.81);Pain Pain - Right/Left: Left Pain - part of body: Shoulder     Time: 4098-1191 PT Time Calculation (min) (ACUTE ONLY): 22 min  Charges:  $Gait Training: 8-22 mins                    G Codes:       Erline Levine, PTA Pager: [phone]     Carolynne Edouard 04/19/2016, 4:50 PM

## 2016-04-19 NOTE — Progress Notes (Signed)
Occupational Therapy Treatment Patient Details Name: [name] MRN: [id number] DOB: [birth year] Today's Date: 04/19/2016    History of present illness 83 yo female s/p fall with Comminuted left wrist intra-articular distal radius fracture of 3 or more fragments, grade 2 open distal radius fracture, left proximal humerus 3 part fracture. s/p 4/1 L wrist brachial radialis tendon release, forearm flexor release, open treatmet of L wrist open distal fx with debridement of skin tissue and bone with excisional debridement, closed manipulation of L proximal humerus fx , biomet DVR cross lock narrow plate with several CC of biomet stay graft bone graft substitute.   OT comments  Pt with syncopal event in bathroom with LOC and required feet elevation with SCD to help pt arouse. Pt with BP 66/32 initially reclined in chair with feet up. RN at bedside addressing changes. Pt with better edema control L UE and carter mission block present in room.    Follow Up Recommendations  Home health OT;Other (comment)    Equipment Recommendations  3 in 1 bedside commode    Recommendations for Other Services      Precautions / Restrictions Precautions Precautions: Fall Required Braces or Orthoses: Sling Restrictions LUE Weight Bearing: Non weight bearing       Mobility Bed Mobility               General bed mobility comments: in chair on arrival  Transfers Overall transfer level: Needs assistance Equipment used: 1 person hand held assist Transfers: Sit to/from Stand Sit to Stand: Mod assist         General transfer comment: cues for safety and total (A) to don splint    Balance Overall balance assessment: Needs assistance         Standing balance support: Single extremity supported Standing balance-Leahy Scale: Poor                             ADL either performed or assessed with clinical judgement   ADL Overall ADL's : Needs assistance/impaired                          Toilet Transfer: Moderate assistance Toilet Transfer Details (indicate cue type and reason): requires (A) on the R side for balance to reach bathroom.  Toileting- Clothing Manipulation and Hygiene: Total assistance Toileting - Clothing Manipulation Details (indicate cue type and reason): pt reports feeling "faint" Pt sit<>Stand for per care with recliner in bathroom and pt with syncopal event. pt required total +2 to position in chair with no response. pt with BIL LE elevated head reclined and SCD applied to bil LE . pt with palor look and white lips. pt not responding initially and slowing responding to staff. pt frequently states "i get nauseated real easily" pt states this every bathroom trip since admission. pt with x1 transfer to bathroom this AM with tech and sitting in chair on arrival with feet elevated without symptoms.  RN and rapid response in room      Functional mobility during ADLs: Moderate assistance General ADL Comments: Pt with decr edema in L UE and MCP close to baseline in swelling at this time. ACe wrap was incorrectly don so L UE rewrapped by OT. pt with carter mission blue block in room with L UE in block. Block with elbow rest removed ( per tech by daughter) and pt placing L UE in block reverse of the  design of the block.      Vision   Vision Assessment?: Vision impaired- to be further tested in functional context   Perception     Praxis      Cognition Arousal/Alertness: Lethargic Behavior During Therapy: Flat affect Overall Cognitive Status: Impaired/Different from baseline                                 General Comments: Pt with syncopal event while in bathroom. Rn called and RN called rapid response. pt with decr BP ( see vitals input by RN). Pt able to recall DOB and x2 fx but confused on location of injuries        Exercises Other Exercises Other Exercises: splint checked and decr edema at MCP. pt reports "feels good"  when asked. pt with close to baseline MCP compared to R hand.  Other Exercises: ( due to syncopal event session stopped)   Shoulder Instructions       General Comments      Pertinent Vitals/ Pain       Pain Assessment: Faces Faces Pain Scale: Hurts little more Pain Location: L UE  Pain Descriptors / Indicators: Aching;Grimacing;Discomfort Pain Intervention(s): Monitored during session;Premedicated before session;Repositioned  Home Living                                          Prior Functioning/Environment              Frequency  Min 2X/week        Progress Toward Goals  OT Goals(current goals can now be found in the care plan section)  Progress towards OT goals: Progressing toward goals  Acute Rehab OT Goals Patient Stated Goal: wanting to get to the bathroom OT Goal Formulation: With patient/family Time For Goal Achievement: 04/30/16 Potential to Achieve Goals: Good ADL Goals Pt Will Transfer to Toilet: with min guard assist;bedside commode;ambulating Additional ADL Goal #1: Pt and daughter will don and doff sling mod i Additional ADL Goal #2: Pt and daughter will complete toilet transfer supervision Additional ADL Goal #3: Pt will demonstrate digit AROM mod I to decr edema  Plan Discharge plan remains appropriate    Co-evaluation                 End of Session    OT Visit Diagnosis: Pain Pain - Right/Left: Left Pain - part of body: Arm;Hand;Shoulder   Activity Tolerance Other (comment) (see rapid response / rn notes)   Patient Left in chair;with call bell/phone within reach;with nursing/sitter in room;with SCD's reapplied   Nurse Communication Mobility status;Precautions        Time: 6213 [phone] OT Time Calculation (min): 25 min  Charges: OT General Charges $OT Visit: 1 Procedure OT Treatments $Self Care/Home Management : 23-37 mins   Mateo Flow   OTR/L Pager: [phone] Office: [phone]    Boone Master B 04/19/2016, 9:44 AM

## 2016-04-19 NOTE — Progress Notes (Signed)
About 0900 [name] notified me that patient had a vagal response. BP dropped to 66/32. Notified rapid response and Dr. Selena Batten. Bolus ordered for patient. Patient BP up to 108/57.  States she is feeling much better.

## 2016-04-19 NOTE — Significant Event (Signed)
Rapid Response Event Note  Overview:  Called by RN for hypotension  Time Called: 0906 Arrival Time: 0910 Event Type: Hypotension  Initial Focused Assessment:  Called by RN for hypotension and syncopal episode.  On my arrival to patients room, RN, PT at bedside, Patient reclined in chair, alert and oriented.  AS per PT, she was on commode when she suddenly became unresponsive and BP was noted to be 66/32.  Patieint states she felt light headed and dizzy, now feels much better.     Interventions:  BP up to 108/57.  RN attempting to obtain PIV.  Recommend calling MD to update and small bolus.    Plan of Care (if not transferred):  Rn to monitor and encourage fluids  Event Summary:  RN to call if assistance needed   at      at          [facility], [place] Amos

## 2016-04-19 NOTE — Progress Notes (Signed)
Patient ID: [name], female   DOB: 1918/10/12, 83 y.o.   MRN: [id number]                                                                PROGRESS NOTE                                                                                                                                                                                                             Patient Demographics:    [name], is a 83 y.o. female, DOB - Sep 02, 1918, WUJ:[id number]  Admit date - 04/13/2016   Admitting Physician Briscoe Deutscher, MD  Outpatient Primary MD for the patient is Pcp Not In System  LOS - 6  Outpatient Specialists:     Chief Complaint  Patient presents with  . Fall       Brief Narrative  Pt admitted for further evaluation and management for new leftdistal radius and proximal humerus fracture that she has sustained after an episode of fall (fell on an outstretched left upper extremity).     Subjective:    [name] today has,syncope earlier today.  When was in the bathroom, witnessed per staff.  Pt felt slightly light headed,  Appears orthostatic.  Denies cp, palp, sob, n/v, diarrhea, brbpr.      Assessment  & Plan :    Active Problems:   Essential hypertension   Open fracture of left wrist   Wrist fracture   Malnutrition of moderate degree   1.  Syncope likely vasovagal (pt appears orthostatic) Tele Check trop I q6h x3 Check cardiac echo,  Check carotid ultrasound Hydrate with ns iv    Comminuted left wrist intra-articular distal radius fracture of 3 or more fragments, grade 2 open distal radius fracture, left proximal humerus 3 part fracture - pt is now s/p following: post op day #5 1. Open treatment of left wrist intra-articular distal radius fracture 3 or more fragments. 2. Left wrist brachial radialis tendon release, forearm flexor release. 3. Open treatment of left wrist injury intra-articular open distal radius fracture with debridement of skin subQ tissue and  bone, excisional debridement. 4. Traumatic wound laceration 7-1/2 cm repair, complex wound closure 5. Close manipulation of left proximal humerus fracture requiring manipulation and anesthesia - per ortho, pt to  remain nonweightbearing in the left upper extremity - continue with the long-arm splint for a total of 2 weeks - continue with IV ABX Cefepime for one more day as pt still with leukocytosis and hand still somewhat red and swollen, stop Vancomycin today  - follow-up in Dr. Glenna Durand office in approximately 2 weeks for wound check. - short arm cast immobilization for an additional 3 weeks, we'll then begin therapy regimen at that time - continue with the sling until decision is made about further intervention for the shoulder.  - currently on Vanc and Cefepime, today is day #5, plan to d/c Cefepime tomorrow if WBC back to normal limits  - d/c vanc today - CBC In AM   Acute kidney injury - pre renal - pt eating better, Cr is down a little bit  - BMP In AM  Post op blood loss anemia - drop in Hg since admission, post op related and possible dilutional component - no indication for transfusion at this time, Hg stable this AM  - CBC in AM  Hypokalemia - supplement and repeat BMP in AM  Thrombocytopenia - mild, reactive but overall stable and WNL this AM  - CBC in AM  HTN essential - reasonable control given pain from acute illness - continue Lisinopril and Metoprolol, lasix    HLD - continue statin   DVT prophylaxis: SCD's Code Status: Full  Family Communication: Patient at bedside, left message for pt's daughter again (second time) on her cell phone and to call me back at her convenience. I also left my card with pt and with my cell phone number to be contacted for an update.  Disposition Plan: home likely in AM   Consultants:   Ortho   Procedures: done by Dr. Melvyn Novas in 4/1   Open treatment of left wrist intra-articular distal radius fracture 3 or more  fragments.  Left wrist brachial radialis tendon release, forearm flexor release.  Open treatment of left wrist injury intra-articular open distal radius fracture with debridement of skin subQ tissue and bone, excisional debridement.  Traumatic wound laceration 7-1/2 cm repair, complex wound closure  Close manipulation of left proximal humerus fracture requiring manipulation and anesthesia  Antimicrobials:   Vancomycin 4/1 --> 4/5   Cefepime 4/2 -->     Lab Results  Component Value Date   PLT 273 04/19/2016      Anti-infectives    Start     Dose/Rate Route Frequency Ordered Stop   04/15/16 1030  ceFEPIme (MAXIPIME) 1 g in dextrose 5 % 50 mL IVPB     1 g 100 mL/hr over 30 Minutes Intravenous Every 24 hours 04/15/16 0945     04/15/16 0900  vancomycin (VANCOCIN) 500 mg in sodium chloride 0.9 % 100 mL IVPB  Status:  Discontinued     500 mg 100 mL/hr over 60 Minutes Intravenous Every 48 hours 04/15/16 0819 04/18/16 1006   04/14/16 2200  ceFAZolin (ANCEF) IVPB 1 g/50 mL premix  Status:  Discontinued     1 g 100 mL/hr over 30 Minutes Intravenous Every 12 hours 04/14/16 1300 04/15/16 0945   04/14/16 1100  vancomycin (VANCOCIN) 500 mg in sodium chloride 0.9 % 100 mL IVPB  Status:  Discontinued     500 mg 100 mL/hr over 60 Minutes Intravenous Every 48 hours 04/14/16 1031 04/15/16 0819   04/13/16 2215  ceFAZolin (ANCEF) IVPB 1 g/50 mL premix     1 g 100 mL/hr over 30 Minutes Intravenous  Once 04/13/16  2202 04/13/16 2320        Objective:   Vitals:   04/19/16 0900 04/19/16 0905 04/19/16 0915 04/19/16 0935  BP: (!) 66/32 (!) 77/48 (!) 108/57 (!) 108/46  Pulse:      Resp:      Temp:      TempSrc:      SpO2:        Wt Readings from Last 3 Encounters:  04/12/16 42.2 kg (93 lb)  04/16/10 48.2 kg (106 lb 3.2 oz)  04/18/09 53.8 kg (118 lb 8 oz)     Intake/Output Summary (Last 24 hours) at 04/19/16 1026 Last data filed at 04/18/16 1817  Gross per 24 hour  Intake               240 ml  Output                0 ml  Net              240 ml     Physical Exam  Awake Alert, Oriented X 3, No new F.N deficits, Normal affect Harris.AT,PERRAL Supple Neck,No JVD, No cervical lymphadenopathy appriciated.  Symmetrical Chest wall movement, Good air movement bilaterally, CTAB RRR, S1, S2 2/6 sem rusb +ve B.Sounds, Abd Soft, No tenderness, No organomegaly appriciated, No rebound - guarding or rigidity. No Cyanosis, Clubbing or edema, No new Rash or bruise      Data Review:    CBC  Recent Labs Lab 04/12/16 1918 04/13/16 2226  04/15/16 0536 04/16/16 0607 04/17/16 0337 04/18/16 0602 04/19/16 0515  WBC 7.7 12.7*  < > 13.3* 10.7* 12.0* 10.8* 9.1  HGB 13.3 12.0  < > 9.8* 9.4* 10.6* 10.9* 10.6*  HCT 40.6 37.6  < > 30.1* 28.4* 32.6* 33.6* 31.6*  PLT 176 157  < > 131* 134* 190 266 273  MCV 96.0 97.7  < > 95.6 95.6 95.0 94.4 96.0  MCH 31.4 31.2  < > 31.1 31.6 30.9 30.6 32.2  MCHC 32.8 31.9  < > 32.6 33.1 32.5 32.4 33.5  RDW 13.4 13.5  < > 14.0 13.9 13.6 13.1 13.5  LYMPHSABS 1.3 1.3  --   --   --   --   --   --   MONOABS 1.0 1.2*  --   --   --   --   --   --   EOSABS 0.0 0.0  --   --   --   --   --   --   BASOSABS 0.0 0.0  --   --   --   --   --   --   < > = values in this interval not displayed.  Chemistries   Recent Labs Lab 04/15/16 0536 04/16/16 0607 04/17/16 0337 04/18/16 0602 04/19/16 0515  NA 137 140 140 139 140  K 4.2 4.0 4.7 3.1* 3.7  CL 103 103 98* 94* 96*  CO2 25 28 34* 35* 33*  GLUCOSE 120* 92 112* 110* 105*  BUN 21*  CREATININE 1.15* 1.10* 1.24* 1.13* 1.27*  CALCIUM 9.0 9.4 9.8 9.3 9.2   ------------------------------------------------------------------------------------------------------------------ No results for input(s): CHOL, HDL, LDLCALC, TRIG, CHOLHDL, LDLDIRECT in the last 72 hours.  No results found for:  HGBA1C ------------------------------------------------------------------------------------------------------------------ No results for input(s): TSH, T4TOTAL, T3FREE, THYROIDAB in the last 72 hours.  Invalid input(s): FREET3 ------------------------------------------------------------------------------------------------------------------ No results for input(s): VITAMINB12, FOLATE, FERRITIN, TIBC, IRON, RETICCTPCT in the last 72 hours.  Coagulation profile  Recent  Labs Lab 04/13/16 2226  INR 0.94    No results for input(s): DDIMER in the last 72 hours.  Cardiac Enzymes No results for input(s): CKMB, TROPONINI, MYOGLOBIN in the last 168 hours.  Invalid input(s): CK ------------------------------------------------------------------------------------------------------------------ No results found for: BNP  Inpatient Medications  Scheduled Meds: . ceFEPime (MAXIPIME) IV  1 g Intravenous Q24H  . feeding supplement  1 Container Oral BID BM  . furosemide  20 mg Oral QHS  . lisinopril  5 mg Oral Daily  . loratadine  10 mg Oral Daily  . metoprolol succinate  25 mg Oral Daily  . multivitamin with minerals  1 tablet Oral Daily  . pantoprazole  40 mg Oral Daily  . predniSONE  5 mg Oral Daily  . simvastatin  40 mg Oral q1800  . sodium chloride  500 mL Intravenous Once   Continuous Infusions: PRN Meds:.HYDROcodone-acetaminophen, ondansetron (ZOFRAN) IV  Micro Results Recent Results (from the past 240 hour(s))  Urine culture     Status: None   Collection Time: 04/12/16  6:57 PM  Result Value Ref Range Status   Specimen Description URINE, RANDOM  Final   Special Requests NONE  Final   Culture   Final    NO GROWTH Performed at [facility], [address] [place], [place] [address]    Report Status 04/14/2016 FINAL  Final  Surgical pcr screen     Status: None   Collection Time: 04/14/16  4:31 AM  Result Value Ref Range Status   MRSA, PCR NEGATIVE NEGATIVE Final    Staphylococcus aureus NEGATIVE NEGATIVE Final    Comment:        The Xpert SA Assay (FDA approved for NASAL specimens in patients over 24 years of age), is one component of a comprehensive surveillance program.  Test performance has been validated by [facility] for patients greater than or equal to 50 year old. It is not intended to diagnose infection nor to guide or monitor treatment.     Radiology Reports Dg Wrist Complete Left  Result Date: 04/13/2016 CLINICAL DATA:  Trip and fall.  LEFT forearm bleeding and deformity. EXAM: LEFT WRIST - COMPLETE 3+ VIEW COMPARISON:  None. FINDINGS: Acute comminuted distal radial fracture with intra-articular extension and impaction. Mild volar angulation of distal bony fragments. Acute distal ulnar fracture with moderate volar angulation of distal bony fragments, no intra-articular extension. No dislocation. No destructive bony lesions. Osteopenia. Severe first carpometacarpal osteoarthrosis with joint space narrowing, periarticular sclerosis and marginal spurring. Thick Bandage overlies the dorsal wrist. Minimal subcutaneous gas within the medial wrist, no definite radiopaque foreign bodies. IMPRESSION: Acute displaced distal radius and ulnar open fractures. No dislocation. Electronically Signed   By: Awilda Metro M.D.   On: 04/13/2016 22:25   Ct Head Wo Contrast  Result Date: 04/12/2016 CLINICAL DATA:  83 year old with altered mental status. Headache today. EXAM: CT HEAD WITHOUT CONTRAST TECHNIQUE: Contiguous axial images were obtained from the base of the skull through the vertex without intravenous contrast. COMPARISON:  None. FINDINGS: Brain: Generalized cerebral atrophy. Mild for age chronic small vessel ischemia. Remote lacunar infarct in the left basal ganglia versus prominent perivascular space. No hemorrhage. No CT findings of acute ischemia. No subdural or extra-axial collection. No hydrocephalus, mass effect or midline shift. Vascular:  Atherosclerosis of skullbase vasculature without hyperdense vessel or abnormal calcification. Skull: Normal. Negative for fracture or focal lesion. Sinuses/Orbits: Mucous retention cyst in the sphenoid sinus with mild mucosal thickening. Mild mucosal thickening of  right sphenoid sinus. Mastoid air cells are well-aerated. Visualized orbits are unremarkable. Other: None. IMPRESSION: 1.  No acute intracranial abnormality. 2. Generalized atrophy and mild chronic small vessel ischemia. Remote lacunar infarct in the left basal ganglia. Electronically Signed   By: Rubye Oaks M.D.   On: 04/12/2016 21:30   Dg Chest Port 1 View  Result Date: 04/14/2016 CLINICAL DATA:  Preoperative chest radiograph. Status post fall. Initial encounter. EXAM: PORTABLE CHEST 1 VIEW COMPARISON:  Chest radiograph and CT of the chest performed 10/14/2010 FINDINGS: The lungs are well-aerated. Minimal bibasilar atelectasis is noted. There is no evidence of pleural effusion or pneumothorax. The cardiomediastinal silhouette is mildly enlarged. A large hiatal hernia is noted, containing fluid and air. There is a displaced fracture at the surgical neck of the left humerus. IMPRESSION: 1. Minimal bibasilar atelectasis noted.  Lungs otherwise clear. 2. Displaced fracture at the surgical neck of the left humerus. 3. Large hiatal hernia, containing fluid and air. 4. Mild cardiomegaly. Electronically Signed   By: Roanna Raider M.D.   On: 04/14/2016 00:46   Dg Shoulder Left  Result Date: 04/14/2016 CLINICAL DATA:  Proximal left humerus fracture; she reports fall several days ago; her entire left arm was bruised EXAM: LEFT SHOULDER - 2+ VIEW COMPARISON:  04/13/2016 FINDINGS: There is a comminuted fracture of the proximal humerus. There is a transverse fracture across metaphysis. There is a fracture across the base of the greater tuberosity. The shaft fracture component has displaced superiorly, 11 mm, in relation to the humerus. There is varus  angulation between the humeral head and shaft components. Humeral head is mildly subluxed inferiorly suggesting a joint hemarthrosis. No dislocation. AC joint is normally aligned. IMPRESSION: 1. Mildly displaced comminuted fracture of the proximal left humerus. Electronically Signed   By: Amie Portland M.D.   On: 04/14/2016 15:02   Dg Shoulder Left  Result Date: 04/13/2016 CLINICAL DATA:  Fall with left shoulder pain EXAM: LEFT SHOULDER - 2+ VIEW COMPARISON:  None. FINDINGS: There is a comminuted left proximal humerus fracture involving the surgical neck and greater tuberosity, with significant impaction and approximately 1.6 cm superolateral displacement of the dominant distal fracture fragment. Left acromioclavicular joint appears intact. No dislocation at the left glenohumeral joint. No suspicious focal osseous lesions. No radiopaque foreign body. Aortic atherosclerosis. IMPRESSION: Comminuted impacted displaced left proximal humerus fracture involving the surgical neck and greater tuberosity. Electronically Signed   By: Delbert Phenix M.D.   On: 04/13/2016 22:24   Dg Hand Complete Right  Result Date: 04/13/2016 CLINICAL DATA:  Fall.  Right hand pain. EXAM: RIGHT HAND - COMPLETE 3+ VIEW COMPARISON:  None. FINDINGS: No fracture or dislocation. Severe polyarticular erosive osteoarthritis throughout the interphalangeal joints of the first through fifth fingers. Severe osteoarthritis at the first carpometacarpal joint. Moderate osteoarthritis at the first through third metacarpophalangeal joints. No suspicious focal osseous lesions. No radiopaque foreign bodies. IMPRESSION: No fracture or dislocation in the right hand. 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 Electronically Signed   By: Delbert Phenix M.D.   On: 04/13/2016 22:26    Time Spent in minutes  30   Pearson Grippe M.D on 04/19/2016 at 10:26 AM  Between 7am to 7pm - Pager - [phone]  After 7pm go to [email or web address] - password  [facility]  [facility] -  Office  [phone]

## 2016-04-20 ENCOUNTER — Inpatient Hospital Stay (HOSPITAL_COMMUNITY): Payer: Medicare Other

## 2016-04-20 DIAGNOSIS — S42292B Other displaced fracture of upper end of left humerus, initial encounter for open fracture: Secondary | ICD-10-CM

## 2016-04-20 DIAGNOSIS — S62102B Fracture of unspecified carpal bone, left wrist, initial encounter for open fracture: Secondary | ICD-10-CM

## 2016-04-20 LAB — CBC
HEMATOCRIT: 28.9 % — AB (ref 36.0–46.0)
Hemoglobin: 9.4 g/dL — ABNORMAL LOW (ref 12.0–15.0)
MCH: 31.2 pg (ref 26.0–34.0)
MCHC: 32.5 g/dL (ref 30.0–36.0)
MCV: 96 fL (ref 78.0–100.0)
PLATELETS: 256 10*3/uL (ref 150–400)
RBC: 3.01 MIL/uL — ABNORMAL LOW (ref 3.87–5.11)
RDW: 13.1 % (ref 11.5–15.5)
WBC: 9.8 10*3/uL (ref 4.0–10.5)

## 2016-04-20 LAB — COMPREHENSIVE METABOLIC PANEL
ALBUMIN: 2.3 g/dL — AB (ref 3.5–5.0)
ALT: 13 U/L — ABNORMAL LOW (ref 14–54)
AST: 17 U/L (ref 15–41)
Alkaline Phosphatase: 42 U/L (ref 38–126)
Anion gap: 11 (ref 5–15)
BUN: 21 mg/dL — ABNORMAL HIGH (ref 6–20)
CALCIUM: 8.8 mg/dL — AB (ref 8.9–10.3)
CO2: 26 mmol/L (ref 22–32)
Chloride: 104 mmol/L (ref 101–111)
Creatinine, Ser: 1.07 mg/dL — ABNORMAL HIGH (ref 0.44–1.00)
GFR calc Af Amer: 50 mL/min — ABNORMAL LOW (ref >60)
GFR calc non Af Amer: 43 mL/min — ABNORMAL LOW (ref >60)
Glucose, Bld: 96 mg/dL (ref 65–99)
POTASSIUM: 3.5 mmol/L (ref 3.5–5.1)
SODIUM: 141 mmol/L (ref 135–145)
Total Bilirubin: 0.8 mg/dL (ref 0.3–1.2)
Total Protein: 5.2 g/dL — ABNORMAL LOW (ref 6.5–8.1)

## 2016-04-20 LAB — GLUCOSE, CAPILLARY: Glucose-Capillary: 109 mg/dL — ABNORMAL HIGH (ref 65–99)

## 2016-04-20 NOTE — Progress Notes (Signed)
Occupational Therapy Treatment Patient Details Name: [name] MRN: [id number] DOB: 05-24-1918 Today's Date: 04/20/2016    History of present illness 83 yo female s/p fall with Comminuted left wrist intra-articular distal radius fracture of 3 or more fragments, grade 2 open distal radius fracture, left proximal humerus 3 part fracture. s/p 4/1 L wrist brachial radialis tendon release, forearm flexor release, open treatmet of L wrist open distal fx with debridement of skin tissue and bone with excisional debridement, closed manipulation of L proximal humerus fx , biomet DVR cross lock narrow plate with several CC of biomet stay graft bone graft substitute.   OT comments  This 83 yo female seen today to check edema from splint in LUE. There was edema above splint at hand (mainly in MCPs and proximally v. distally.  Follow Up Recommendations  Home health OT    Equipment Recommendations  3 in 1 bedside commode       Precautions / Restrictions Precautions Precautions: Fall Precaution Comments: sling  Required Braces or Orthoses: Sling Restrictions Weight Bearing Restrictions: Yes LUE Weight Bearing: Non weight bearing              ADL either performed or assessed with clinical judgement                  Cognition Arousal/Alertness: Awake/alert Behavior During Therapy: Restless (emotional crying at times) Overall Cognitive Status: Impaired/Different from baseline Area of Impairment: Problem solving                             Problem Solving: Requires verbal cues;Requires tactile cues General Comments: Gave pt a piece of red and blue tubing to work in her LUE as a "squeeze ball" since a true squeeze ball will not fit in her hand due to splint and padding. Pt kept saying she could not squeeze it when she actually was and she kept trying to place the tubing down under padding        Exercises  Upon entering pt had her LUE propped up on 2 pieces of yellow  foam padding, hand was edematous from end of cast to MCPs but with minimal edema distal to MCPs. Pt reported that he splint felt tight and it did appear this way. Removed ace wraps (not splint) and worked on retrograde massage to digits and hand with edema decreasing and had her work on full extension of digits and flexion as much as she was able. Once ace wraps reapplied gave her pieces of red and blue foam to use to work on squeezing. Really reiterated the need to keep LUE propped up as high and as much as possible to help decrease edema.           Pertinent Vitals/ Pain       Pain Assessment: Faces Faces Pain Scale: Hurts even more Pain Location: L UE with movement Pain Descriptors / Indicators: Aching;Grimacing;Guarding Pain Intervention(s): Limited activity within patient's tolerance;Monitored during session;Repositioned     Prior Functioning/Environment              Frequency  Min 2X/week        Progress Toward Goals  OT Goals(current goals can now be found in the care plan section)  Progress towards OT goals: Progressing toward goals     Plan Discharge plan remains appropriate       End of Session    OT Visit Diagnosis: Pain Pain - Right/Left: Left  Pain - part of body: Arm;Hand;Shoulder   Activity Tolerance Patient tolerated treatment well   Patient Left in bed;with family/visitor present   Nurse Communication  (Need to really work on pt keeping her LUE elevated; I gave pt some tubing to work on squeezing with her LUE)        Time: 1610-9604 OT Time Calculation (min): 35 min  Charges: OT General Charges $OT Visit: 1 Procedure OT Treatments $Therapeutic Exercise: 23-37 mins  Ignacia Palma, OTR/L [phone] 04/20/2016

## 2016-04-20 NOTE — Progress Notes (Signed)
qPhysical Therapy Treatment Patient Details Name: [name] MRN: [id number] DOB: November 16, 1918 Today's Date: 04/20/2016    History of Present Illness 83 yo female s/p fall with Comminuted left wrist intra-articular distal radius fracture of 3 or more fragments, grade 2 open distal radius fracture, left proximal humerus 3 part fracture. s/p 4/1 L wrist brachial radialis tendon release, forearm flexor release, open treatmet of L wrist open distal fx with debridement of skin tissue and bone with excisional debridement, closed manipulation of L proximal humerus fx , biomet DVR cross lock narrow plate with several CC of biomet stay graft bone graft substitute.  Patient tolerated treatment well. She increased her gait distance. She had one minor loss of balance upon initial standing. She had no LOB with gait but required Min guard. Current plan remains appropriate.   PT Comments       Follow Up Recommendations  Home health PT;Supervision/Assistance - 24 hour;Other (comment)     Equipment Recommendations  None recommended by PT    Recommendations for Other Services Other (comment)     Precautions / Restrictions Precautions Precautions: Fall Precaution Comments: sling  Required Braces or Orthoses: Sling Restrictions Weight Bearing Restrictions: Yes LUE Weight Bearing: Non weight bearing    Mobility  Bed Mobility Overal bed mobility: Needs Assistance       Supine to sit: Min assist     General bed mobility comments: Min a to sit at the edge of the bed.   Transfers Overall transfer level: Needs assistance Equipment used: 1 person hand held assist Transfers: Sit to/from Stand Sit to Stand: Min assist         General transfer comment: Upon initial standing the patient had a slight loss of balance with therapy correction.   Ambulation/Gait Ambulation/Gait assistance: Min assist;Mod assist Ambulation Distance (Feet): 130 Feet Assistive device: 1 person hand held  assist Gait Pattern/deviations: Step-through pattern;Decreased stride length;Narrow base of support     General Gait Details: No lob with gait today.    Stairs            Wheelchair Mobility    Modified Rankin (Stroke Patients Only)       Balance Overall balance assessment: Needs assistance Sitting-balance support: Single extremity supported;Feet supported Sitting balance-Leahy Scale: Fair     Standing balance support: Single extremity supported Standing balance-Leahy Scale: Poor                              Cognition Arousal/Alertness: Awake/alert Behavior During Therapy: WFL for tasks assessed/performed Overall Cognitive Status: Within Functional Limits for tasks assessed Area of Impairment: Problem solving                 Orientation Level: Disoriented to;Situation   Memory: Decreased recall of precautions;Decreased short-term memory       Problem Solving: Requires verbal cues;Requires tactile cues General Comments: Improved cognition this afternoon       Exercises      General Comments        Pertinent Vitals/Pain Pain Assessment: Faces Faces Pain Scale: Hurts little more Pain Location: L UE with movement Pain Descriptors / Indicators: Aching;Grimacing;Guarding Pain Intervention(s): Limited activity within patient's tolerance;Monitored during session;Premedicated before session;Repositioned    Home Living                      Prior Function            PT Goals (  current goals can now be found in the care plan section) Acute Rehab PT Goals PT Goal Formulation: Patient unable to participate in goal setting Time For Goal Achievement: 04/30/16 Potential to Achieve Goals: Good Progress towards PT goals: Progressing toward goals    Frequency    Min 6X/week      PT Plan Current plan remains appropriate    Co-evaluation             End of Session Equipment Utilized During Treatment: Gait belt Activity  Tolerance: Patient tolerated treatment well Patient left: in chair;with call bell/phone within reach;with family/visitor present (daughter will stay in the room with the patient ) Nurse Communication: Mobility status PT Visit Diagnosis: Unsteadiness on feet (R26.81);Pain Pain - Right/Left: Left Pain - part of body: Shoulder     Time: 4098-1191 PT Time Calculation (min) (ACUTE ONLY): 15 min  Charges:  $Gait Training: 8-22 mins                    G Codes:         Dessie Coma PT DPT  04/20/2016, 5:01 PM

## 2016-04-20 NOTE — Progress Notes (Signed)
Patient ID: [name], female   DOB: [birth year], 83 y.o.   MRN: [id number]                                                                PROGRESS NOTE                                                                                                                                                                                                             Patient Demographics:    [name], is a 83 y.o. female, DOB - 10/24/1918, WUJ:[id number]  Admit date - 04/13/2016   Admitting Physician Briscoe Deutscher, MD  Outpatient Primary MD for the patient is Pcp Not In System  LOS - 7  Outpatient Specialists:  Chief Complaint  Patient presents with  . Fall       Brief Narrative   Pt admitted for further evaluation and management for new leftdistal radius and proximal humerus fracture that she has sustained after an episode of fall (fell on an outstretched left upper extremity).    Subjective:    [name] today has not had any further syncope.  Trop  Negative.  Denies cp, palp, sob, n/v, diarrhea, dysuria.  Eating ok.  Slight confusion this am,  Axox2 (person, place)   Assessment  & Plan :    Active Problems:   Essential hypertension   Open fracture of left wrist   Wrist fracture   Malnutrition of moderate degree   Syncope    1.  Syncope likely vasovagal (pt appears orthostatic) Tele Check trop I q6h x3 Check cardiac echo, results pending Check carotid ultrasound pending Hydrate with ns iv  Check orthostatic bp today  Comminuted left wrist intra-articular distal radius fracture of 3 or more fragments, grade 2 open distal radius fracture, left proximal humerus 3 part fracture - pt is now s/p following: post op day #6 1. Open treatment of left wrist intra-articular distal radius fracture 3 or more fragments. 2. Left wrist brachial radialis tendon release, forearm flexor release. 3. Open treatment of left wrist injury intra-articular open distal radius fracture  with debridement of skin subQ tissue and bone, excisional debridement. 4. Traumatic wound laceration 7-1/2 cm repair, complex wound closure 5. Close manipulation of left proximal humerus fracture requiring manipulation and anesthesia -  per ortho, pt to remain nonweightbearing in the left upper extremity - continue with the long-arm splint for a total of 2 weeks - continue with IV ABX Cefepime for one more day as pt still with leukocytosis and hand still somewhat red and swollen, stop Vancomycin today  - follow-up in Dr. Glenna Durand office in approximately 2 weeks for wound check. - short arm cast immobilization for an additional 3 weeks, we'll then begin therapy regimen at that time - continue with the sling until decision is made about further intervention for the shoulder.  - currently on  Cefepime, today is day #6, plan to d/c Cefepime4/8  - CBC In AM   Acute kidney injury - pre renal - pt eating better, Cr is down a little bit  - BMP In AM  Post op blood loss anemia - drop in Hg since admission, post op related and possible dilutional component - no indication for transfusion at this time, Hg stable this AM  - CBC in AM  Hypokalemia - supplement and repeat BMP in AM  Thrombocytopenia - mild, reactive but overall stable and WNL this AM  - CBC in AM  HTN essential - reasonable control given pain from acute illness - continue Lisinopril and Metoprolol, lasix   HLD - continue statin   DVT prophylaxis:SCD's Code Status:Full  Family Communication:Patient at bedside, left message for pt's daughter again (second time)on her cell phone and to call me back at her convenience. I also left my card with pt and with my cell phone number to be contacted for an update.  Disposition Plan:awaiting syncope w/up to be completed  Consultants:  Ortho   Procedures:done by Dr. Melvyn Novas in 4/1   Open treatment of left wrist intra-articular distal radius fracture 3 or more  fragments.  Left wrist brachial radialis tendon release, forearm flexor release.  Open treatment of left wrist injury intra-articular open distal radius fracture with debridement of skin subQ tissue and bone, excisional debridement.  Traumatic wound laceration 7-1/2 cm repair, complex wound closure  Close manipulation of left proximal humerus fracture requiring manipulation and anesthesia  Antimicrobials:   Vancomycin 4/1 -->4/5   Cefepime 4/2 -->     Lab Results  Component Value Date   PLT 256 04/20/2016      Anti-infectives    Start     Dose/Rate Route Frequency Ordered Stop   04/15/16 1030  ceFEPIme (MAXIPIME) 1 g in dextrose 5 % 50 mL IVPB     1 g 100 mL/hr over 30 Minutes Intravenous Every 24 hours 04/15/16 0945     04/15/16 0900  vancomycin (VANCOCIN) 500 mg in sodium chloride 0.9 % 100 mL IVPB  Status:  Discontinued     500 mg 100 mL/hr over 60 Minutes Intravenous Every 48 hours 04/15/16 0819 04/18/16 1006   04/14/16 2200  ceFAZolin (ANCEF) IVPB 1 g/50 mL premix  Status:  Discontinued     1 g 100 mL/hr over 30 Minutes Intravenous Every 12 hours 04/14/16 1300 04/15/16 0945   04/14/16 1100  vancomycin (VANCOCIN) 500 mg in sodium chloride 0.9 % 100 mL IVPB  Status:  Discontinued     500 mg 100 mL/hr over 60 Minutes Intravenous Every 48 hours 04/14/16 1031 04/15/16 0819   04/13/16 2215  ceFAZolin (ANCEF) IVPB 1 g/50 mL premix     1 g 100 mL/hr over 30 Minutes Intravenous  Once 04/13/16 2202 04/13/16 2320        Objective:   Vitals:  04/19/16 1119 04/19/16 1323 04/19/16 2100 04/20/16 0520  BP: (!) 103/47 130/61 (!) 115/56 (!) 151/63  Pulse:  87 85 87  Resp:  16  17  Temp:  98.6 F (37 C) 98.9 F (37.2 C) 99.7 F (37.6 C)  TempSrc:  Oral Oral Oral  SpO2:  95% 94% 95%    Wt Readings from Last 3 Encounters:  04/12/16 42.2 kg (93 lb)  04/16/10 48.2 kg (106 lb 3.2 oz)  04/18/09 53.8 kg (118 lb 8 oz)     Intake/Output Summary (Last 24 hours) at  04/20/16 0842 Last data filed at 04/19/16 1500  Gross per 24 hour  Intake              620 ml  Output                0 ml  Net              620 ml     Physical Exam  Awake Alert, Oriented X 3, No new F.N deficits, Normal affect [place].AT,PERRAL Supple Neck,No JVD, No cervical lymphadenopathy appriciated.  Symmetrical Chest wall movement, Good air movement bilaterally, CTAB RRR,s1, s2 2/6 sem apex +ve B.Sounds, Abd Soft, No tenderness, No organomegaly appriciated, No rebound - guarding or rigidity. No Cyanosis, Clubbing or edema, No new Rash or bruise   Left arm in cast    Data Review:    CBC  Recent Labs Lab 04/13/16 2226  04/16/16 0607 04/17/16 0337 04/18/16 0602 04/19/16 0515 04/20/16 0544  WBC 12.7*  < > 10.7* 12.0* 10.8* 9.1 9.8  HGB 12.0  < > 9.4* 10.6* 10.9* 10.6* 9.4*  HCT 37.6  < > 28.4* 32.6* 33.6* 31.6* 28.9*  PLT 157  < > 134* 190 266 273 256  MCV 97.7  < > 95.6 95.0 94.4 96.0 96.0  MCH 31.2  < > 31.6 30.9 30.6 32.2 31.2  MCHC 31.9  < > 33.1 32.5 32.4 33.5 32.5  RDW 13.5  < > 13.9 13.6 13.1 13.5 13.1  LYMPHSABS 1.3  --   --   --   --   --   --   MONOABS 1.2*  --   --   --   --   --   --   EOSABS 0.0  --   --   --   --   --   --   BASOSABS 0.0  --   --   --   --   --   --   < > = values in this interval not displayed.  Chemistries   Recent Labs Lab 04/16/16 0607 04/17/16 0337 04/18/16 0602 04/19/16 0515 04/20/16 0544  NA 140 140 139 140 141  K 4.0 4.7 3.1* 3.7 3.5  CL 103 98* 94* 96* 104  CO2 28 34* 35* 33* 26  GLUCOSE 92 112* 110* 105* 96  BUN 21* 21*  CREATININE 1.10* 1.24* 1.13* 1.27* 1.07*  CALCIUM 9.4 9.8 9.3 9.2 8.8*  AST  --   --   --   --  17  ALT  --   --   --   --  13*  ALKPHOS  --   --   --   --  42  BILITOT  --   --   --   --  0.8   ------------------------------------------------------------------------------------------------------------------ No results for input(s): CHOL, HDL, LDLCALC, TRIG, CHOLHDL, LDLDIRECT in  the last 72 hours.  No results found  for: HGBA1C ------------------------------------------------------------------------------------------------------------------ No results for input(s): TSH, T4TOTAL, T3FREE, THYROIDAB in the last 72 hours.  Invalid input(s): FREET3 ------------------------------------------------------------------------------------------------------------------ No results for input(s): VITAMINB12, FOLATE, FERRITIN, TIBC, IRON, RETICCTPCT in the last 72 hours.  Coagulation profile  Recent Labs Lab 04/13/16 2226  INR 0.94    No results for input(s): DDIMER in the last 72 hours.  Cardiac Enzymes  Recent Labs Lab 04/19/16 1031 04/19/16 1633 04/19/16 2234  TROPONINI <0.03 <0.03 <0.03   ------------------------------------------------------------------------------------------------------------------ No results found for: BNP  Inpatient Medications  Scheduled Meds: . ceFEPime (MAXIPIME) IV  1 g Intravenous Q24H  . feeding supplement  1 Container Oral BID BM  . lisinopril  5 mg Oral Daily  . loratadine  10 mg Oral Daily  . metoprolol succinate  25 mg Oral Daily  . multivitamin with minerals  1 tablet Oral Daily  . pantoprazole  40 mg Oral Daily  . predniSONE  5 mg Oral Daily   Continuous Infusions: PRN Meds:.HYDROcodone-acetaminophen, ondansetron (ZOFRAN) IV, polyethylene glycol  Micro Results Recent Results (from the past 240 hour(s))  Urine culture     Status: None   Collection Time: 04/12/16  6:57 PM  Result Value Ref Range Status   Specimen Description URINE, RANDOM  Final   Special Requests NONE  Final   Culture   Final    NO GROWTH Performed at [facility], [address], [place], [place] [address]    Report Status 04/14/2016 FINAL  Final  Surgical pcr screen     Status: None   Collection Time: 04/14/16  4:31 AM  Result Value Ref Range Status   MRSA, PCR NEGATIVE NEGATIVE Final   Staphylococcus aureus NEGATIVE NEGATIVE Final     Comment:        The Xpert SA Assay (FDA approved for NASAL specimens in patients over 6 years of age), is one component of a comprehensive surveillance program.  Test performance has been validated by [facility] for patients greater than or equal to 24 year old. It is not intended to diagnose infection nor to guide or monitor treatment.     Radiology Reports Dg Wrist Complete Left  Result Date: 04/13/2016 CLINICAL DATA:  Trip and fall.  LEFT forearm bleeding and deformity. EXAM: LEFT WRIST - COMPLETE 3+ VIEW COMPARISON:  None. FINDINGS: Acute comminuted distal radial fracture with intra-articular extension and impaction. Mild volar angulation of distal bony fragments. Acute distal ulnar fracture with moderate volar angulation of distal bony fragments, no intra-articular extension. No dislocation. No destructive bony lesions. Osteopenia. Severe first carpometacarpal osteoarthrosis with joint space narrowing, periarticular sclerosis and marginal spurring. Thick Bandage overlies the dorsal wrist. Minimal subcutaneous gas within the medial wrist, no definite radiopaque foreign bodies. IMPRESSION: Acute displaced distal radius and ulnar open fractures. No dislocation. Electronically Signed   By: Awilda Metro M.D.   On: 04/13/2016 22:25   Ct Head Wo Contrast  Result Date: 04/12/2016 CLINICAL DATA:  83 year old with altered mental status. Headache today. EXAM: CT HEAD WITHOUT CONTRAST TECHNIQUE: Contiguous axial images were obtained from the base of the skull through the vertex without intravenous contrast. COMPARISON:  None. FINDINGS: Brain: Generalized cerebral atrophy. Mild for age chronic small vessel ischemia. Remote lacunar infarct in the left basal ganglia versus prominent perivascular space. No hemorrhage. No CT findings of acute ischemia. No subdural or extra-axial collection. No hydrocephalus, mass effect or midline shift. Vascular: Atherosclerosis of skullbase vasculature without  hyperdense vessel or abnormal calcification. Skull: Normal. Negative for fracture  or focal lesion. Sinuses/Orbits: Mucous retention cyst in the sphenoid sinus with mild mucosal thickening. Mild mucosal thickening of right sphenoid sinus. Mastoid air cells are well-aerated. Visualized orbits are unremarkable. Other: None. IMPRESSION: 1.  No acute intracranial abnormality. 2. Generalized atrophy and mild chronic small vessel ischemia. Remote lacunar infarct in the left basal ganglia. Electronically Signed   By: Rubye Oaks M.D.   On: 04/12/2016 21:30   Dg Chest Port 1 View  Result Date: 04/14/2016 CLINICAL DATA:  Preoperative chest radiograph. Status post fall. Initial encounter. EXAM: PORTABLE CHEST 1 VIEW COMPARISON:  Chest radiograph and CT of the chest performed 10/14/2010 FINDINGS: The lungs are well-aerated. Minimal bibasilar atelectasis is noted. There is no evidence of pleural effusion or pneumothorax. The cardiomediastinal silhouette is mildly enlarged. A large hiatal hernia is noted, containing fluid and air. There is a displaced fracture at the surgical neck of the left humerus. IMPRESSION: 1. Minimal bibasilar atelectasis noted.  Lungs otherwise clear. 2. Displaced fracture at the surgical neck of the left humerus. 3. Large hiatal hernia, containing fluid and air. 4. Mild cardiomegaly. Electronically Signed   By: Roanna Raider M.D.   On: 04/14/2016 00:46   Dg Shoulder Left  Result Date: 04/14/2016 CLINICAL DATA:  Proximal left humerus fracture; she reports fall several days ago; her entire left arm was bruised EXAM: LEFT SHOULDER - 2+ VIEW COMPARISON:  04/13/2016 FINDINGS: There is a comminuted fracture of the proximal humerus. There is a transverse fracture across metaphysis. There is a fracture across the base of the greater tuberosity. The shaft fracture component has displaced superiorly, 11 mm, in relation to the humerus. There is varus angulation between the humeral head and shaft  components. Humeral head is mildly subluxed inferiorly suggesting a joint hemarthrosis. No dislocation. AC joint is normally aligned. IMPRESSION: 1. Mildly displaced comminuted fracture of the proximal left humerus. Electronically Signed   By: Amie Portland M.D.   On: 04/14/2016 15:02   Dg Shoulder Left  Result Date: 04/13/2016 CLINICAL DATA:  Fall with left shoulder pain EXAM: LEFT SHOULDER - 2+ VIEW COMPARISON:  None. FINDINGS: There is a comminuted left proximal humerus fracture involving the surgical neck and greater tuberosity, with significant impaction and approximately 1.6 cm superolateral displacement of the dominant distal fracture fragment. Left acromioclavicular joint appears intact. No dislocation at the left glenohumeral joint. No suspicious focal osseous lesions. No radiopaque foreign body. Aortic atherosclerosis. IMPRESSION: Comminuted impacted displaced left proximal humerus fracture involving the surgical neck and greater tuberosity. Electronically Signed   By: Delbert Phenix M.D.   On: 04/13/2016 22:24   Dg Hand Complete Right  Result Date: 04/13/2016 CLINICAL DATA:  Fall.  Right hand pain. EXAM: RIGHT HAND - COMPLETE 3+ VIEW COMPARISON:  None. FINDINGS: No fracture or dislocation. Severe polyarticular erosive osteoarthritis throughout the interphalangeal joints of the first through fifth fingers. Severe osteoarthritis at the first carpometacarpal joint. Moderate osteoarthritis at the first through third metacarpophalangeal joints. No suspicious focal osseous lesions. No radiopaque foreign bodies. IMPRESSION: No fracture or dislocation in the right hand. 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 Electronically Signed   By: Delbert Phenix M.D.   On: 04/13/2016 22:26    Time Spent in minutes  30   Pearson Grippe M.D on 04/20/2016 at 8:42 AM  Between 7am to 7pm - Pager - [phone]  After 7pm go to [email or web address] - password [facility]  [facility] -  Office   [phone]

## 2016-04-21 ENCOUNTER — Inpatient Hospital Stay (HOSPITAL_COMMUNITY): Payer: Medicare Other

## 2016-04-21 DIAGNOSIS — S62102A Fracture of unspecified carpal bone, left wrist, initial encounter for closed fracture: Secondary | ICD-10-CM

## 2016-04-21 DIAGNOSIS — S42292B Other displaced fracture of upper end of left humerus, initial encounter for open fracture: Secondary | ICD-10-CM

## 2016-04-21 DIAGNOSIS — R55 Syncope and collapse: Secondary | ICD-10-CM

## 2016-04-21 DIAGNOSIS — S62109A Fracture of unspecified carpal bone, unspecified wrist, initial encounter for closed fracture: Secondary | ICD-10-CM

## 2016-04-21 DIAGNOSIS — I10 Essential (primary) hypertension: Secondary | ICD-10-CM

## 2016-04-21 LAB — CBC
HCT: 27.7 % — ABNORMAL LOW (ref 36.0–46.0)
HEMOGLOBIN: 8.8 g/dL — AB (ref 12.0–15.0)
MCH: 30.7 pg (ref 26.0–34.0)
MCHC: 31.8 g/dL (ref 30.0–36.0)
MCV: 96.5 fL (ref 78.0–100.0)
PLATELETS: 275 10*3/uL (ref 150–400)
RBC: 2.87 MIL/uL — ABNORMAL LOW (ref 3.87–5.11)
RDW: 13.3 % (ref 11.5–15.5)
WBC: 8.5 10*3/uL (ref 4.0–10.5)

## 2016-04-21 LAB — VAS US CAROTID
LCCADDIAS: -15 cm/s
LCCADSYS: -81 cm/s
LEFT ECA DIAS: -5 cm/s
LEFT VERTEBRAL DIAS: 21 cm/s
LICADDIAS: -22 cm/s
LICAPDIAS: -22 cm/s
LICAPSYS: -81 cm/s
Left CCA prox dias: 17 cm/s
Left CCA prox sys: 96 cm/s
Left ICA dist sys: -121 cm/s
RCCAPDIAS: 16 cm/s
RCCAPSYS: 108 cm/s
RIGHT ECA DIAS: -3 cm/s
RIGHT VERTEBRAL DIAS: 12 cm/s
Right cca dist sys: -120 cm/s

## 2016-04-21 LAB — COMPREHENSIVE METABOLIC PANEL
ALBUMIN: 2.2 g/dL — AB (ref 3.5–5.0)
ALT: 14 U/L (ref 14–54)
ANION GAP: 7 (ref 5–15)
AST: 20 U/L (ref 15–41)
Alkaline Phosphatase: 42 U/L (ref 38–126)
BUN: 19 mg/dL (ref 6–20)
CHLORIDE: 104 mmol/L (ref 101–111)
CO2: 30 mmol/L (ref 22–32)
Calcium: 8.9 mg/dL (ref 8.9–10.3)
Creatinine, Ser: 1.1 mg/dL — ABNORMAL HIGH (ref 0.44–1.00)
GFR calc non Af Amer: 41 mL/min — ABNORMAL LOW (ref >60)
GFR, EST AFRICAN AMERICAN: 48 mL/min — AB (ref >60)
GLUCOSE: 96 mg/dL (ref 65–99)
POTASSIUM: 3.6 mmol/L (ref 3.5–5.1)
SODIUM: 141 mmol/L (ref 135–145)
Total Bilirubin: 0.5 mg/dL (ref 0.3–1.2)
Total Protein: 5.1 g/dL — ABNORMAL LOW (ref 6.5–8.1)

## 2016-04-21 LAB — ECHOCARDIOGRAM COMPLETE
AO mean calculated velocity dopler: 140 cm/s
AOPV: 0.71 m/s
AV Area VTI index: 1.34 cm2/m2
AV Peak grad: 21 mmHg
AV pk vel: 231 cm/s
AVAREAMEANV: 1.89 cm2
AVAREAMEANVIN: 1.35 cm2/m2
AVAREAVTI: 1.8 cm2
AVCELMEANRAT: 0.74
AVG: 10 mmHg
CHL CUP AV PEAK INDEX: 1.29
CHL CUP AV VEL: 1.88
CHL CUP RV SYS PRESS: 44 mmHg
E decel time: 313 msec
FS: 41 % (ref 28–44)
HEIGHTINCHES: 62 in
IVS/LV PW RATIO, ED: 1
LA ID, A-P, ES: 31 mm
LA diam end sys: 31 mm
LA diam index: 2.21 cm/m2
LA vol A4C: 38.4 ml
LAVOL: 41.5 mL
LAVOLIN: 29.6 mL/m2
LDCA: 2.54 cm2
LV TDI E'LATERAL: 6.31
LV TDI E'MEDIAL: 4.68
LV e' LATERAL: 6.31 cm/s
LVOT SV: 102 mL
LVOT VTI: 40.3 cm
LVOT peak grad rest: 11 mmHg
LVOTD: 18 mm
LVOTPV: 164 cm/s
LVOTVTI: 0.74 cm
Lateral S' vel: 12.2 cm/s
MV Dec: 313
MV pk A vel: 145 m/s
MV pk E vel: 0.7 m/s
PW: 10 mm — AB (ref 0.6–1.1)
Reg peak vel: 299 cm/s
TAPSE: 20.7 mm
TR max vel: 299 cm/s
VTI: 54.5 cm
Valve area index: 1.34
Valve area: 1.88 cm2
Weight: 1488.55 oz

## 2016-04-21 LAB — GLUCOSE, CAPILLARY: Glucose-Capillary: 95 mg/dL (ref 65–99)

## 2016-04-21 MED ORDER — HYDROCODONE-ACETAMINOPHEN 5-325 MG PO TABS: 1.0000 | ORAL_TABLET | ORAL | 0 refills | Status: DC | PRN

## 2016-04-21 MED ORDER — POLYETHYLENE GLYCOL 3350 17 G PO PACK: 17.0000 g | PACK | Freq: Every day | ORAL | 0 refills | Status: DC | PRN

## 2016-04-21 MED ORDER — PANTOPRAZOLE SODIUM 40 MG PO TBEC: 40.0000 mg | DELAYED_RELEASE_TABLET | Freq: Every day | ORAL | 0 refills | Status: DC

## 2016-04-21 MED ORDER — LORATADINE 10 MG PO TABS: 10.0000 mg | ORAL_TABLET | Freq: Every day | ORAL | 0 refills | Status: DC

## 2016-04-21 MED ORDER — FUROSEMIDE 20 MG PO TABS: 20.0000 mg | ORAL_TABLET | Freq: Every day | ORAL | 0 refills | Status: DC | PRN

## 2016-04-21 MED ORDER — BOOST / RESOURCE BREEZE PO LIQD: 1.0000 | Freq: Two times a day (BID) | ORAL | 0 refills | Status: DC

## 2016-04-21 NOTE — Progress Notes (Signed)
Discharge instructions and prescriptions provided to patient's daughter.  Physician reviewed all medications prior to discharge with daughter.  IV removed.  PT met with patient prior to discharge.  No questions at time of discharge.

## 2016-04-21 NOTE — Progress Notes (Signed)
  Echocardiogram 2D Echocardiogram has been performed.  Tye Savoy 04/21/2016, 12:59 PM

## 2016-04-21 NOTE — Progress Notes (Signed)
qPhysical Therapy Treatment Patient Details Name: [name] MRN: [id number] DOB: 01-24-1918 Today's Date: 04/21/2016    History of Present Illness 83 yo female s/p fall with Comminuted left wrist intra-articular distal radius fracture of 3 or more fragments, grade 2 open distal radius fracture, left proximal humerus 3 part fracture. s/p 4/1 L wrist brachial radialis tendon release, forearm flexor release, open treatmet of L wrist open distal fx with debridement of skin tissue and bone with excisional debridement, closed manipulation of L proximal humerus fx , biomet DVR cross lock narrow plate with several CC of biomet stay graft bone graft substitute.    PT Comments    Pt continues to be moving well with therapy. Improved bed mobs, transfers and gait distance noted this session. Pt has 1 minimal LOB while clinician was adjusting the gait belt but was able to recover with Min A. Pt is able to negotiate to bathroom with daugther's assistance. Daughter is PCG at home and will be available to assist as needed. Daughter was able to independently don sling and assist with UE positioning in chair.    Follow Up Recommendations  Home health PT;Supervision/Assistance - 24 hour;Other (comment)     Equipment Recommendations  None recommended by PT    Recommendations for Other Services       Precautions / Restrictions Precautions Precautions: Fall Precaution Comments: sling  Required Braces or Orthoses: Sling Restrictions Weight Bearing Restrictions: Yes LUE Weight Bearing: Non weight bearing    Mobility  Bed Mobility Overal bed mobility: Needs Assistance Bed Mobility: Supine to Sit     Supine to sit: Min guard     General bed mobility comments: Min guard to EOB  Transfers Overall transfer level: Needs assistance Equipment used: 1 person hand held assist Transfers: Sit to/from Stand Sit to Stand: Min assist         General transfer comment: Min A upon standing with  slight LOB as clincian adjusts gait belt  Ambulation/Gait Ambulation/Gait assistance: Min guard;Min assist Ambulation Distance (Feet): 150 Feet Assistive device: None Gait Pattern/deviations: Step-through pattern;Decreased stride length;Narrow base of support Gait velocity: decreased Gait velocity interpretation: Below normal speed for age/gender General Gait Details: No lob with gait today.    Stairs            Wheelchair Mobility    Modified Rankin (Stroke Patients Only)       Balance Overall balance assessment: Needs assistance Sitting-balance support: No upper extremity supported;Feet supported Sitting balance-Leahy Scale: Fair                                      Cognition Arousal/Alertness: Awake/alert Behavior During Therapy: WFL for tasks assessed/performed Overall Cognitive Status: History of cognitive impairments - at baseline                                        Exercises      General Comments        Pertinent Vitals/Pain Pain Assessment: Faces Faces Pain Scale: Hurts a little bit Pain Location: LUE Pain Descriptors / Indicators: Guarding;Sore Pain Intervention(s): Monitored during session;Repositioned;Ice applied    Home Living                      Prior Function  PT Goals (current goals can now be found in the care plan section) Acute Rehab PT Goals Patient Stated Goal: none stated Progress towards PT goals: Progressing toward goals    Frequency    Min 6X/week      PT Plan Current plan remains appropriate    Co-evaluation             End of Session Equipment Utilized During Treatment: Gait belt Activity Tolerance: Patient tolerated treatment well Patient left: in chair;with call bell/phone within reach;with family/visitor present Nurse Communication: Mobility status PT Visit Diagnosis: Unsteadiness on feet (R26.81);Pain Pain - Right/Left: Left Pain - part of body:  Shoulder     Time: 4098-1191 PT Time Calculation (min) (ACUTE ONLY): 15 min  Charges:  $Gait Training: 8-22 mins                    G Codes:       Colin Broach PT, DPT  [phone]    Ruel Favors Aletha Halim 04/21/2016, 2:56 PM

## 2016-04-21 NOTE — Discharge Summary (Signed)
[name], is a 83 y.o. female  DOB 10-18-1918  MRN [id number].  Admission date:  04/13/2016  Admitting Physician  Briscoe Deutscher, MD  Discharge Date:  04/21/2016   Primary MD  Pcp Not In System  Recommendations for primary care physician for things to follow:    Syncope likely vasovagal (pt appears orthostatic) Please f/u with pcp in 1 week for orthostatic bp  Comminuted left wrist intra-articular distal radius fracture of 3 or more fragments, grade 2 open distal radius fracture, left proximal humerus 3 part fracture - pt is now s/p following: post op day #7 1. Open treatment of left wrist intra-articular distal radius fracture 3 or more fragments. 2. Left wrist brachial radialis tendon release, forearm flexor release. 3. Open treatment of left wrist injury intra-articular open distal radius fracture with debridement of skin subQ tissue and bone, excisional debridement. 4. Traumatic wound laceration 7-1/2 cm repair, complex wound closure 5. Close manipulation of left proximal humerus fracture requiring manipulation and anesthesia -pt completed cefipime x 7 days -follow-up in Dr. Glenna Durand office in approximately 8-10 days.  for wound check. - short arm cast immobilization for an additional 3 weeks, we'll then begin therapy regimen at that time - continue with the sling until decision is made about further intervention for the shoulder.  PT to evaluate and tx [name])  Acute kidney injury - pre renal - repeat bmp in 1 week -Only take lasix prn edema  Post op blood loss anemia Hgb stable Repeat cbc in 1 week  Hypokalemia - check bmp in 1 week  Thrombocytopenia - stable, repeat cbc in 1 week  HTN essential - check bp in 1 week at pcp office - continue Lisinopril and Metoprolol, use lasix prn  HLD - pt is not taking statin at home  Protein calorie Malnutrition  severe Use feeding supplement Check cmp in 1 week  Admission Diagnosis  Wrist fracture [S62.109A] Preoperative testing [Z01.818] Open fracture of left wrist, initial encounter [S62.102B]   Discharge Diagnosis  Wrist fracture [S62.109A] Preoperative testing [Z01.818] Open fracture of left wrist, initial encounter [S62.102B]    Principal Problem:   Open fracture of left wrist Active Problems:   Essential hypertension   Wrist fracture   Malnutrition of moderate degree      Past Medical History:  Diagnosis Date  . Cardiac arrhythmia   . Cholelithiasis   . DJD (degenerative joint disease)   . Esophageal stricture   . Fibromyalgia   . Hypertension   . Kidney disease   . Osteoarthritis   . Osteoporosis   . Renal calculi   . Sinusitis   . Takotsubo cardiomyopathy 2009    Past Surgical History:  Procedure Laterality Date  . ABDOMINAL HYSTERECTOMY    . CHOLECYSTECTOMY    . EYE SURGERY    . OPEN REDUCTION INTERNAL FIXATION (ORIF) DISTAL RADIAL FRACTURE Left 04/14/2016   Procedure: OPEN REDUCTION INTERNAL FIXATION (ORIF) DISTAL RADIAL FRACTURE;  Surgeon: Bradly Bienenstock, MD;  Location: [facility] OR;  Service: Orthopedics;  Laterality: Left;       HPI  from the history and physical done on the day of admission:     83 y.o. female with medical history significant of takosubo cardiomyopathy with EF of 50%, Osteoporosis, osteoarthritis, PMR on chronic steroids, HTN who presents after a fall associated with wrist and humerus fractures on the left.  She had a fall this afternoon, where she tripped over a dog leash and landed on gravel.  She has associated left shoulder pain.   She was splinted and sent to [facility] for surgical evaluation.  The patient reports some pain, mostly in her shoulder.  She is moving her right wrist well, has echymosis on the right dorsal hand/wrist.  She is somewhat confused, but this is not unexpected in a 83yo at 3am.  She reports some intermittent diarrhea.  She was seen  in the ED on 3/30 for nausea, diarrhea and dehydration; she was given fluids and xanax and was stable for discharge home.  She lives with her daughter who brought her to the ED.  She has a history of Takosubo's Cardiomyopathy and takes lasix.  She has no history of coronary disease, MI, chest pain.  She has no history of pulmonary disease.  She has a "cardiac arrhythmia" noted in her history, but she was not able to give me more details about this. No family in room.   ED Course: In the ED, she was noted to have fractures in the left wrist and humerus.  She was splinted and films were reviewed by Hand Surgery who recommended that she be evaluated for preop recommendations prior to surgery.       Hospital Course:      Pt was admitted and orthopedics consulted for left distal radium and proximal humerus fracture.  Pt was seen by [name] and taken to OR on 04/14/2016.  #1. Open treatment of left wrist intra-articular distal radius fracture 3 or more fragments. #2 left wrist brachial radialis tendon release, forearm flexor release. #3 radiographs 3 views left wrist. #4 open treatment of left wrist injury intra-articular open distal radius fracture with debridement of skin subcutaneous tissue and bone, excisional debridement.  #5 traumatic wound laceration 7-1/2 cm repair, complex wound closure #6 close manipulation of left proximal humerus fracture requiring manipulation and anesthesia IMPLANTS: Biomet DVR cross lock narrow plate with several cc of Biomet stay graft bone graft substitute  Pt had mild ARF which improved with hydration.  Pt had syncope while in the bathroom thought to be vasovagal.  Trop I q6hx3 negative. Carotid ultrasound was negative for significant stenosis.  Cardiac echo showed mild Aortic stenosis. EF wnl.  Pt completed 7 days of cefepime as per ortho for wound infection, Pt appears stable and at baseline according to her daughter and will be discharged today.  Case  Management contacted and apparently Home health in place w [facility].      Follow UP  Follow-up Information    [facility] Follow up.   Specialty:  Home Health Services Why:  Someone from [facility] will contact you to arrange start date and time for your therapy. Contact information: [address] [place] [address] [phone]        Sharma Covert, MD Follow up in 8 day(s).   Specialty:  Orthopedic Surgery Contact information: [address] [place] [address] [phone]        GARNER Gillermina Phy, NP Follow  up in 1 week(s).   Specialty:  Nurse Practitioner Contact information: [address] [place] [address] [phone]            Consults obtained - Orthopedics  Discharge Condition: stable  Diet and Activity recommendation: See Discharge Instructions below  Discharge Instructions    f/u with Dr. Orlan Leavens in 8 days please     Discharge Medications     Allergies as of 04/21/2016      Reactions   Iodinated Diagnostic Agents Rash   Iohexol Rash   Penicillins Rash   Statins Other (See Comments)   myalgias      Medication List    STOP taking these medications   simvastatin 40 MG tablet Commonly known as:  ZOCOR     TAKE these medications   albuterol 108 (90 Base) MCG/ACT inhaler Commonly known as:  PROVENTIL HFA;VENTOLIN HFA Inhale 1 puff into the lungs every 6 (six) hours as needed for wheezing or shortness of breath.   ALPRAZolam 0.25 MG tablet Commonly known as:  XANAX Take 0.25 mg by mouth 2 (two) times daily as needed for anxiety.   CALCIUM 1200-1000 MG-UNIT Chew Chew by mouth. 1 chew daily (doesn't take everyday)   cholecalciferol 1000 units tablet Commonly known as:  VITAMIN D Take 1,000 Units by mouth daily.   dicyclomine 10 MG capsule Commonly known as:  BENTYL Take 10 mg by mouth daily.   diphenoxylate-atropine 2.5-0.025 MG tablet Commonly  known as:  LOMOTIL Take 1 tablet by mouth 4 (four) times daily as needed for diarrhea or loose stools.   EYE-VITES PO Take by mouth daily. Take 1 tablet by mouth daily   feeding supplement Liqd Take 1 Container by mouth 2 (two) times daily between meals.   furosemide 20 MG tablet Commonly known as:  LASIX Take 1 tablet (20 mg total) by mouth daily as needed. What changed:  when to take this  reasons to take this   glucosamine-chondroitin 500-400 MG tablet Take 1 tablet by mouth daily.   HYDROcodone-acetaminophen 5-325 MG tablet Commonly known as:  NORCO/VICODIN Take 1 tablet by mouth every 4 (four) hours as needed for moderate pain or severe pain. What changed:  when to take this  reasons to take this   lisinopril 5 MG tablet Commonly known as:  PRINIVIL,ZESTRIL Take 5 mg by mouth daily.   loratadine 10 MG tablet Commonly known as:  CLARITIN Take 1 tablet (10 mg total) by mouth daily. Start taking on:  04/22/2016   metoprolol succinate 25 MG 24 hr tablet Commonly known as:  TOPROL-XL Take 12.5 mg by mouth daily.   ondansetron 4 MG tablet Commonly known as:  ZOFRAN Take 4 mg by mouth every 8 (eight) hours as needed for nausea.   pantoprazole 40 MG tablet Commonly known as:  PROTONIX Take 1 tablet (40 mg total) by mouth daily. Start taking on:  04/22/2016   polyethylene glycol packet Commonly known as:  MIRALAX / GLYCOLAX Take 17 g by mouth daily as needed for moderate constipation.   predniSONE 5 MG tablet Commonly known as:  DELTASONE Take 5 mg by mouth daily.   promethazine 25 MG tablet Commonly known as:  PHENERGAN Take 25 mg by mouth every 6 (six) hours as needed for nausea or vomiting.   vitamin B-12 100 MCG tablet Commonly known as:  CYANOCOBALAMIN Take 100 mcg by mouth daily.            Durable Medical Equipment  Start     Ordered   04/17/16 1155  For home use only DME Bedside commode  Once    Question:  Patient needs a bedside  commode to treat with the following condition  Answer:  Wrist fracture   04/17/16 1154   04/17/16 0950  For home use only DME 3 n 1  Once     04/17/16 0949      Major procedures and Radiology Reports - PLEASE review detailed and final reports for all details, in brief -      Dg Wrist Complete Left  Result Date: 04/13/2016 CLINICAL DATA:  Trip and fall.  LEFT forearm bleeding and deformity. EXAM: LEFT WRIST - COMPLETE 3+ VIEW COMPARISON:  None. FINDINGS: Acute comminuted distal radial fracture with intra-articular extension and impaction. Mild volar angulation of distal bony fragments. Acute distal ulnar fracture with moderate volar angulation of distal bony fragments, no intra-articular extension. No dislocation. No destructive bony lesions. Osteopenia. Severe first carpometacarpal osteoarthrosis with joint space narrowing, periarticular sclerosis and marginal spurring. Thick Bandage overlies the dorsal wrist. Minimal subcutaneous gas within the medial wrist, no definite radiopaque foreign bodies. IMPRESSION: Acute displaced distal radius and ulnar open fractures. No dislocation. Electronically Signed   By: Awilda Metro M.D.   On: 04/13/2016 22:25   Ct Head Wo Contrast  Result Date: 04/12/2016 CLINICAL DATA:  83 year old with altered mental status. Headache today. EXAM: CT HEAD WITHOUT CONTRAST TECHNIQUE: Contiguous axial images were obtained from the base of the skull through the vertex without intravenous contrast. COMPARISON:  None. FINDINGS: Brain: Generalized cerebral atrophy. Mild for age chronic small vessel ischemia. Remote lacunar infarct in the left basal ganglia versus prominent perivascular space. No hemorrhage. No CT findings of acute ischemia. No subdural or extra-axial collection. No hydrocephalus, mass effect or midline shift. Vascular: Atherosclerosis of skullbase vasculature without hyperdense vessel or abnormal calcification. Skull: Normal. Negative for fracture or focal  lesion. Sinuses/Orbits: Mucous retention cyst in the sphenoid sinus with mild mucosal thickening. Mild mucosal thickening of right sphenoid sinus. Mastoid air cells are well-aerated. Visualized orbits are unremarkable. Other: None. IMPRESSION: 1.  No acute intracranial abnormality. 2. Generalized atrophy and mild chronic small vessel ischemia. Remote lacunar infarct in the left basal ganglia. Electronically Signed   By: Rubye Oaks M.D.   On: 04/12/2016 21:30   Dg Chest Port 1 View  Result Date: 04/14/2016 CLINICAL DATA:  Preoperative chest radiograph. Status post fall. Initial encounter. EXAM: PORTABLE CHEST 1 VIEW COMPARISON:  Chest radiograph and CT of the chest performed 10/14/2010 FINDINGS: The lungs are well-aerated. Minimal bibasilar atelectasis is noted. There is no evidence of pleural effusion or pneumothorax. The cardiomediastinal silhouette is mildly enlarged. A large hiatal hernia is noted, containing fluid and air. There is a displaced fracture at the surgical neck of the left humerus. IMPRESSION: 1. Minimal bibasilar atelectasis noted.  Lungs otherwise clear. 2. Displaced fracture at the surgical neck of the left humerus. 3. Large hiatal hernia, containing fluid and air. 4. Mild cardiomegaly. Electronically Signed   By: Roanna Raider M.D.   On: 04/14/2016 00:46   Dg Shoulder Left  Result Date: 04/14/2016 CLINICAL DATA:  Proximal left humerus fracture; she reports fall several days ago; her entire left arm was bruised EXAM: LEFT SHOULDER - 2+ VIEW COMPARISON:  04/13/2016 FINDINGS: There is a comminuted fracture of the proximal humerus. There is a transverse fracture across metaphysis. There is a fracture across the base of the greater tuberosity. The shaft fracture component  has displaced superiorly, 11 mm, in relation to the humerus. There is varus angulation between the humeral head and shaft components. Humeral head is mildly subluxed inferiorly suggesting a joint hemarthrosis. No  dislocation. AC joint is normally aligned. IMPRESSION: 1. Mildly displaced comminuted fracture of the proximal left humerus. Electronically Signed   By: Amie Portland M.D.   On: 04/14/2016 15:02   Dg Shoulder Left  Result Date: 04/13/2016 CLINICAL DATA:  Fall with left shoulder pain EXAM: LEFT SHOULDER - 2+ VIEW COMPARISON:  None. FINDINGS: There is a comminuted left proximal humerus fracture involving the surgical neck and greater tuberosity, with significant impaction and approximately 1.6 cm superolateral displacement of the dominant distal fracture fragment. Left acromioclavicular joint appears intact. No dislocation at the left glenohumeral joint. No suspicious focal osseous lesions. No radiopaque foreign body. Aortic atherosclerosis. IMPRESSION: Comminuted impacted displaced left proximal humerus fracture involving the surgical neck and greater tuberosity. Electronically Signed   By: Delbert Phenix M.D.   On: 04/13/2016 22:24   Dg Hand Complete Right  Result Date: 04/13/2016 CLINICAL DATA:  Fall.  Right hand pain. EXAM: RIGHT HAND - COMPLETE 3+ VIEW COMPARISON:  None. FINDINGS: No fracture or dislocation. Severe polyarticular erosive osteoarthritis throughout the interphalangeal joints of the first through fifth fingers. Severe osteoarthritis at the first carpometacarpal joint. Moderate osteoarthritis at the first through third metacarpophalangeal joints. No suspicious focal osseous lesions. No radiopaque foreign bodies. IMPRESSION: No fracture or dislocation in the right hand. 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 Electronically Signed   By: Delbert Phenix M.D.   On: 04/13/2016 22:26    Micro Results     Recent Results (from the past 240 hour(s))  Urine culture     Status: None   Collection Time: 04/12/16  6:57 PM  Result Value Ref Range Status   Specimen Description URINE, RANDOM  Final   Special Requests NONE  Final   Culture   Final    NO GROWTH Performed at  [facility], [address], [place], [place] [address]    Report Status 04/14/2016 FINAL  Final  Surgical pcr screen     Status: None   Collection Time: 04/14/16  4:31 AM  Result Value Ref Range Status   MRSA, PCR NEGATIVE NEGATIVE Final   Staphylococcus aureus NEGATIVE NEGATIVE Final    Comment:        The Xpert SA Assay (FDA approved for NASAL specimens in patients over 42 years of age), is one component of a comprehensive surveillance program.  Test performance has been validated by [facility] for patients greater than or equal to 87 year old. It is not intended to diagnose infection nor to guide or monitor treatment.        Today   Subjective    [name] today is doing well.  No further syncope.  Daughter says she is at baseline.  Denies cp, palp, sob, lower ext edema.  left arm in cast.  stable, feels much better wants to go home today.    Objective   Blood pressure (!) 110/97, pulse 80, temperature 98.9 F (37.2 C), temperature source Oral, resp. rate 16, height  (1.575 m), weight 42.2 kg (93 lb 0.6 oz), SpO2 94 %.   Intake/Output Summary (Last 24 hours) at 04/21/16 1343 Last data filed at 04/21/16 0904  Gross per 24 hour  Intake              680 ml  Output                0 ml  Net              680 ml    Exam Awake Alert, Oriented x 3, No new F.N deficits, Normal affect [place].AT,PERRAL Supple Neck,No JVD, No cervical lymphadenopathy appriciated.  Symmetrical Chest wall movement, Good air movement bilaterally, CTAB RRR,No Gallops,Rubs or new Murmurs, No Parasternal Heave +ve B.Sounds, Abd Soft, Non tender, No organomegaly appriciated, No rebound -guarding or rigidity. No Cyanosis, Clubbing or edema, No new Rash or bruise L wrist wrapped/cast, w sling   Data Review   CBC w Diff:  Lab Results  Component Value Date   WBC 8.5 04/21/2016   HGB 8.8 (L) 04/21/2016   HCT 27.7 (L) 04/21/2016   PLT 275 04/21/2016   LYMPHOPCT 10 04/13/2016     MONOPCT 10 04/13/2016   EOSPCT 0 04/13/2016   BASOPCT 0 04/13/2016    CMP:  Lab Results  Component Value Date   NA 141 04/21/2016   K 3.6 04/21/2016   CL 104 04/21/2016   CO2 30 04/21/2016   BUN 19 04/21/2016   CREATININE 1.10 (H) 04/21/2016   PROT 5.1 (L) 04/21/2016   ALBUMIN 2.2 (L) 04/21/2016   BILITOT 0.5 04/21/2016   ALKPHOS 42 04/21/2016   AST 20 04/21/2016   ALT 14 04/21/2016  .   Total Time in preparing paper work, data evaluation and todays exam - 35 minutes  Pearson Grippe M.D on 04/21/2016 at 1:43 PM  [facility]   Office  [phone]

## 2016-04-21 NOTE — Progress Notes (Signed)
Occupational Therapy Treatment Patient Details Name: [name] MRN: [id number] DOB: Aug 25, 1918 Today's Date: 04/21/2016    History of present illness 83 yo female s/p fall with Comminuted left wrist intra-articular distal radius fracture of 3 or more fragments, grade 2 open distal radius fracture, left proximal humerus 3 part fracture. s/p 4/1 L wrist brachial radialis tendon release, forearm flexor release, open treatmet of L wrist open distal fx with debridement of skin tissue and bone with excisional debridement, closed manipulation of L proximal humerus fx , biomet DVR cross lock narrow plate with several CC of biomet stay graft bone graft substitute.   OT comments  L hand continues to be edematous just proximal to MCPs and in digits. Performed retrograde massage to digits and hand, AROM of digits, and positioned LUE in elevation with hand above forearm. Pt with difficulty maintaining proper positioning due to reports of pain in LUE (near shoulder area) when arm elevated. D/c plan remains appropriate. Will continue to follow acutely.   Follow Up Recommendations  Home health OT [facility] aide)    Equipment Recommendations  3 in 1 bedside commode    Recommendations for Other Services      Precautions / Restrictions Precautions Precautions: Fall Precaution Comments: sling  Required Braces or Orthoses: Sling Restrictions Weight Bearing Restrictions: Yes LUE Weight Bearing: Non weight bearing       Mobility Bed Mobility               General bed mobility comments: Pt OOB in chair  Transfers                      Balance                                           ADL either performed or assessed with clinical judgement   ADL Overall ADL's : Needs assistance/impaired                                       General ADL Comments: Total assist for sling management. Increased edema noted just proximal to MCPs and in fingers of L  hand. Pt positioned with LUE minimally elevated sitting in chair upon arrival.     Vision       Perception     Praxis      Cognition Arousal/Alertness: Awake/alert Behavior During Therapy: [facility] for tasks assessed/performed Overall Cognitive Status: No family/caregiver present to determine baseline cognitive functioning Area of Impairment: Memory;Problem solving                     Memory: Decreased short-term memory;Decreased recall of precautions       Problem Solving: Requires verbal cues General Comments: Unable to recall education about LUE that had just been provided to her. Pt repeating "I keep telling people I fell but I really dont know how I ended up in a cast" multiple times during session.        Exercises Other Exercises Other Exercises: Performed retrograde massage to L hand and fingers. Other Exercises: AROM finger flex/ext x10 L hand. Other Exercises: Used blue foam for L hand squeezes x20. Cues throughout for sequencing. Other Exercises: Positioned LUE in elevation with hand above forearm/elbow. Difficult to position in elevation due to LUE  pain, pt indicates pain closer to shoulder area when arm elevated properly.   Shoulder Instructions       General Comments      Pertinent Vitals/ Pain       Pain Assessment: Faces Faces Pain Scale: Hurts a little bit Pain Location: LUE Pain Descriptors / Indicators: Guarding;Sore Pain Intervention(s): Monitored during session;Repositioned;Ice applied  Home Living                                          Prior Functioning/Environment              Frequency  Min 2X/week        Progress Toward Goals  OT Goals(current goals can now be found in the care plan section)  Progress towards OT goals: Progressing toward goals  Acute Rehab OT Goals Patient Stated Goal: none stated OT Goal Formulation: With patient  Plan Discharge plan remains appropriate    Co-evaluation                  End of Session Equipment Utilized During Treatment: Other (comment) (sling)  OT Visit Diagnosis: Pain Pain - Right/Left: Left Pain - part of body: Arm;Hand;Shoulder   Activity Tolerance Patient tolerated treatment well   Patient Left in chair;with call bell/phone within reach;with chair alarm set   Nurse Communication          Time: 1610-9604 OT Time Calculation (min): 18 min  Charges: OT General Charges $OT Visit: 1 Procedure OT Treatments $Therapeutic Activity: 8-22 mins  Arvetta Araque A. Brett Albino, M.S., OTR/L Pager: [phone]   Gaye Alken 04/21/2016, 9:06 AM

## 2016-04-21 NOTE — Progress Notes (Signed)
Leaving for echo, name of transporter is Lucita Lora

## 2016-06-15 ENCOUNTER — Encounter (HOSPITAL_COMMUNITY): Payer: Self-pay | Admitting: Orthopedic Surgery

## 2016-06-15 NOTE — Addendum Note (Signed)
Addendum  created 06/15/16 0802 by Othello Sgroi, MD   Sign clinical note    

## 2016-10-31 DIAGNOSIS — M4005 Postural kyphosis, thoracolumbar region: Secondary | ICD-10-CM | POA: Insufficient documentation

## 2016-10-31 DIAGNOSIS — Z515 Encounter for palliative care: Secondary | ICD-10-CM | POA: Insufficient documentation

## 2016-10-31 DIAGNOSIS — G894 Chronic pain syndrome: Secondary | ICD-10-CM | POA: Insufficient documentation

## 2016-10-31 DIAGNOSIS — M4135 Thoracogenic scoliosis, thoracolumbar region: Secondary | ICD-10-CM | POA: Insufficient documentation

## 2017-01-21 DIAGNOSIS — M51369 Other intervertebral disc degeneration, lumbar region without mention of lumbar back pain or lower extremity pain: Secondary | ICD-10-CM | POA: Insufficient documentation

## 2017-05-10 IMAGING — DX DG HAND COMPLETE 3+V*R*
3 series · 3 of 3 positions shown · non-contrast
Comparison: None.

CLINICAL DATA: Fall.  Right hand pain.

EXAM:
RIGHT HAND - COMPLETE 3+ VIEW

[hand pa]
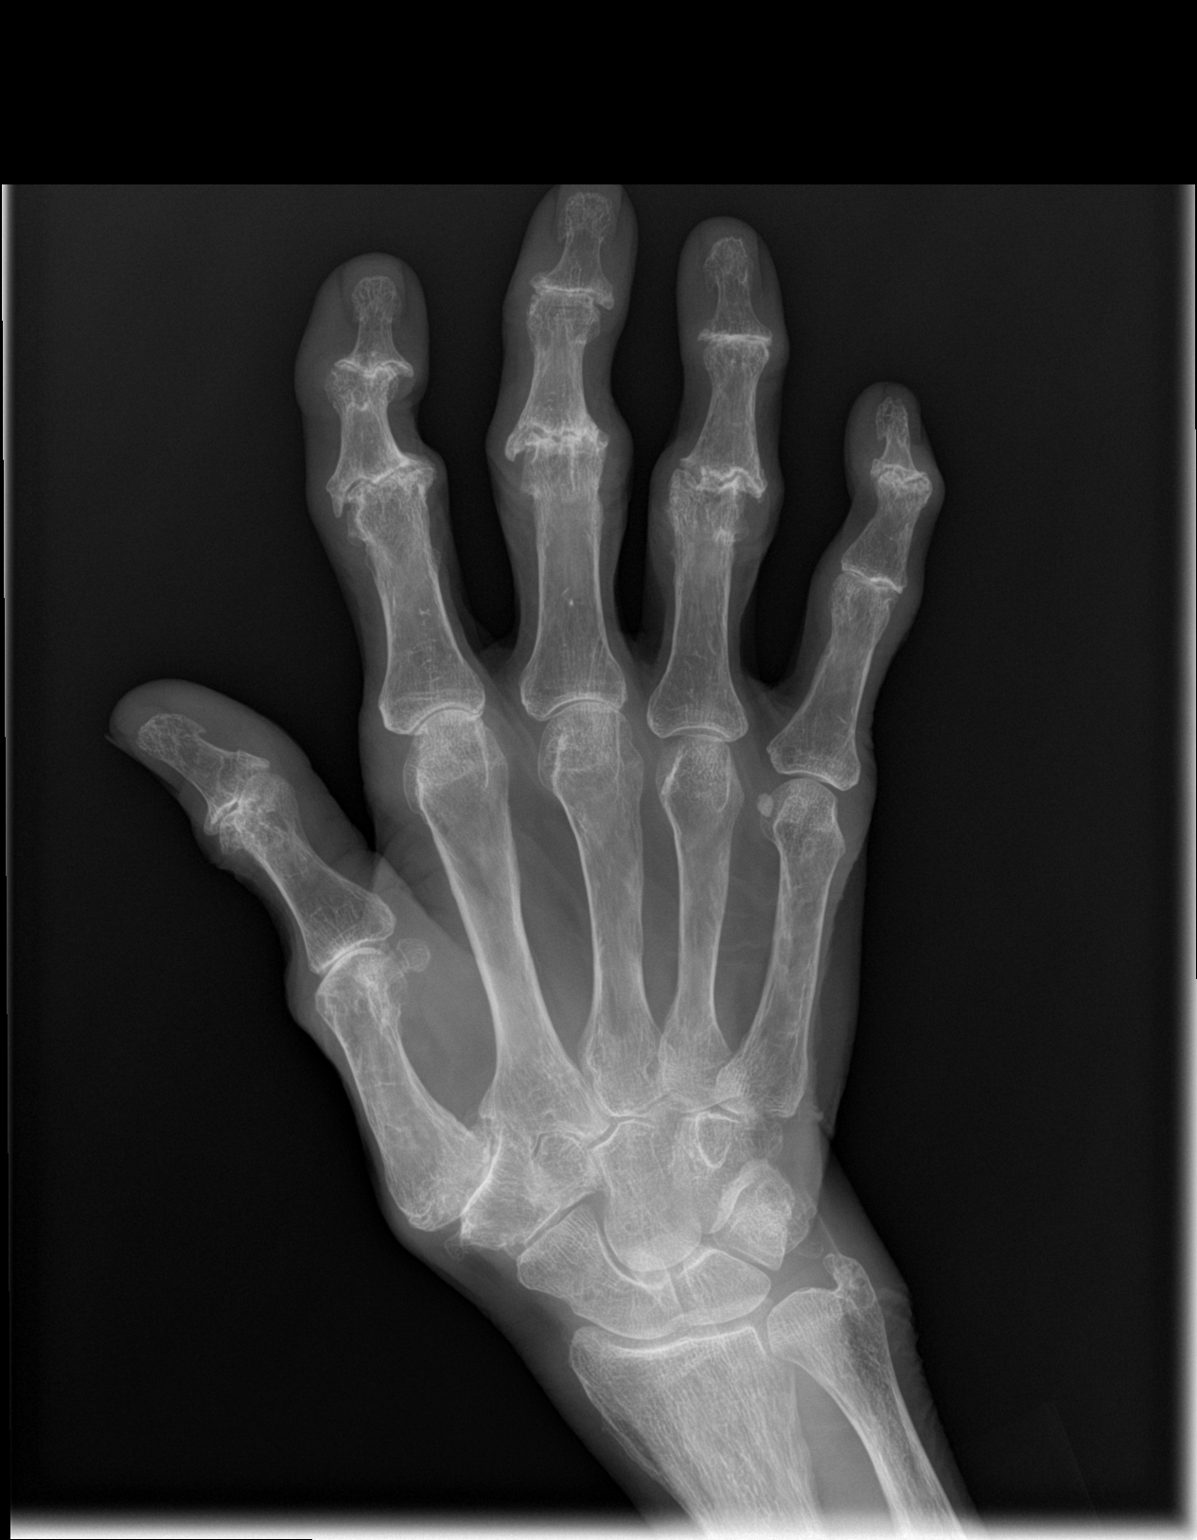

[hand obl]
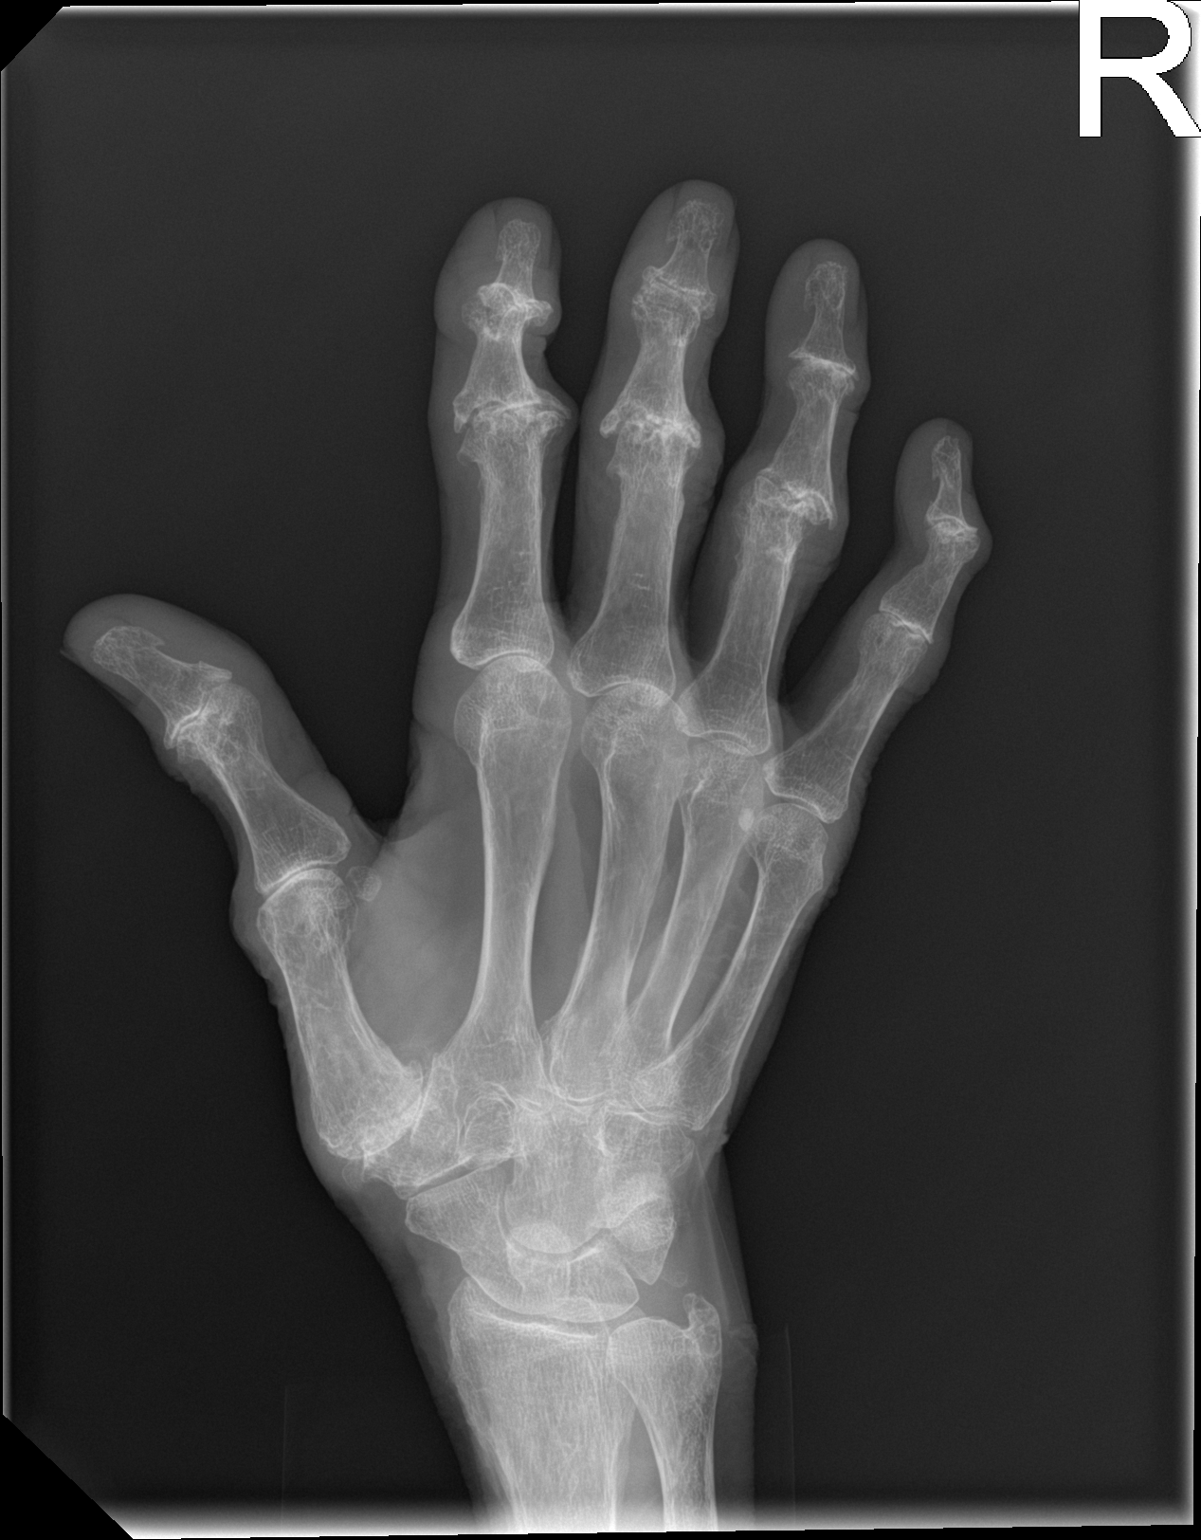

[hand lat]
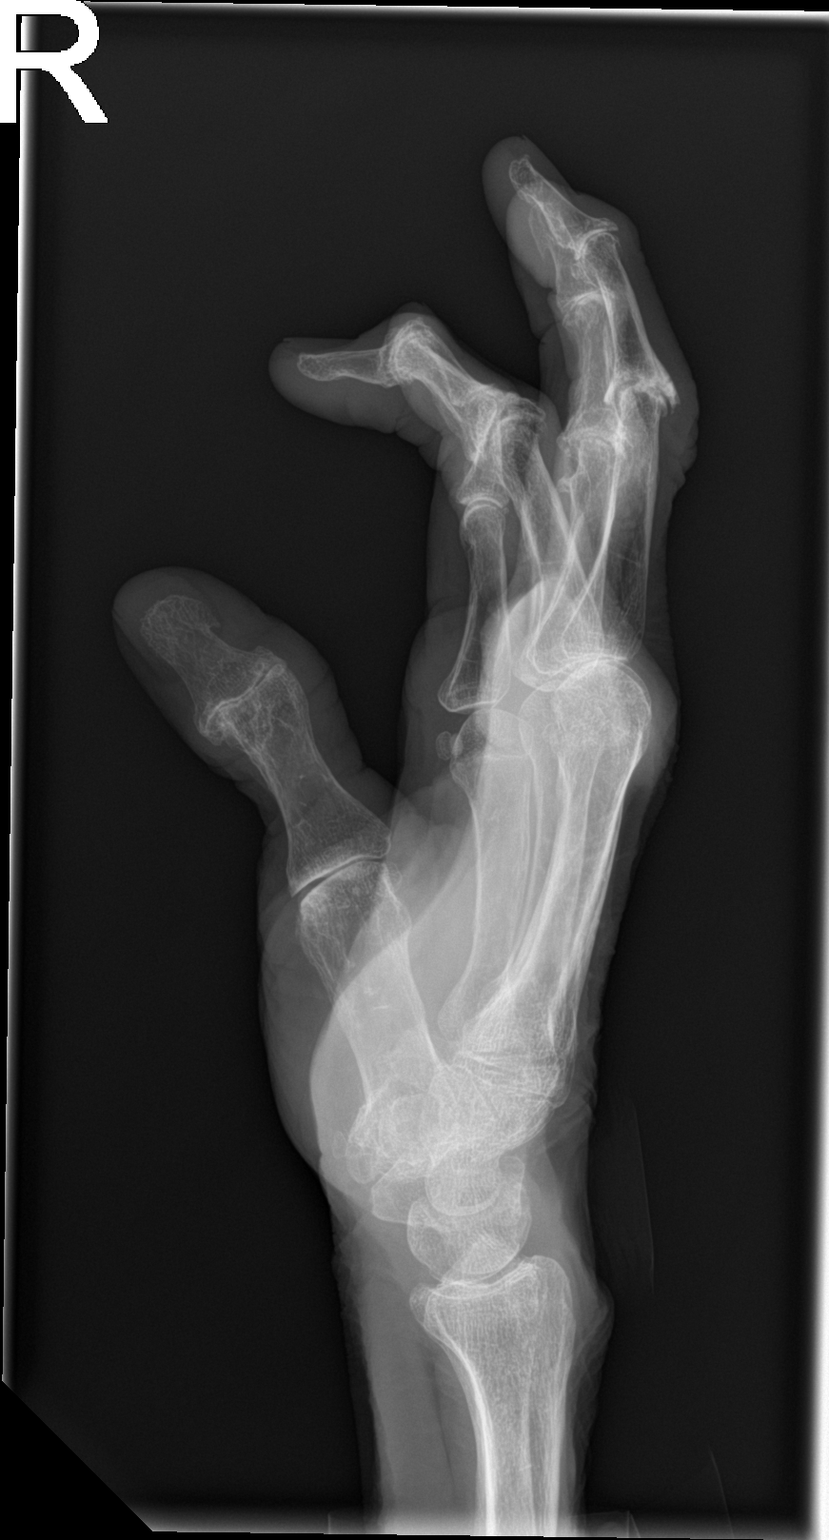

[3 of 3 positions shown; findings below may reference images not displayed]

FINDINGS: No fracture or dislocation. Severe polyarticular erosive
osteoarthritis throughout the interphalangeal joints of the first
through fifth fingers. Severe osteoarthritis at the first
carpometacarpal joint. Moderate osteoarthritis at the first through
third metacarpophalangeal joints. No suspicious focal osseous
lesions. No radiopaque foreign bodies.
IMPRESSION: No fracture or dislocation in the right hand.

Severe polyarticular erosive osteoarthritis in the right hand.

## 2017-05-11 IMAGING — DX DG SHOULDER 2+V*L*
2 series · 2 of 2 positions shown · non-contrast
Comparison: 04/13/2016

CLINICAL DATA: Proximal left humerus fracture; she reports fall
several days ago; her entire left arm was bruised

EXAM:
LEFT SHOULDER - 2+ VIEW

[x shoulder ap left (1 of 2)]
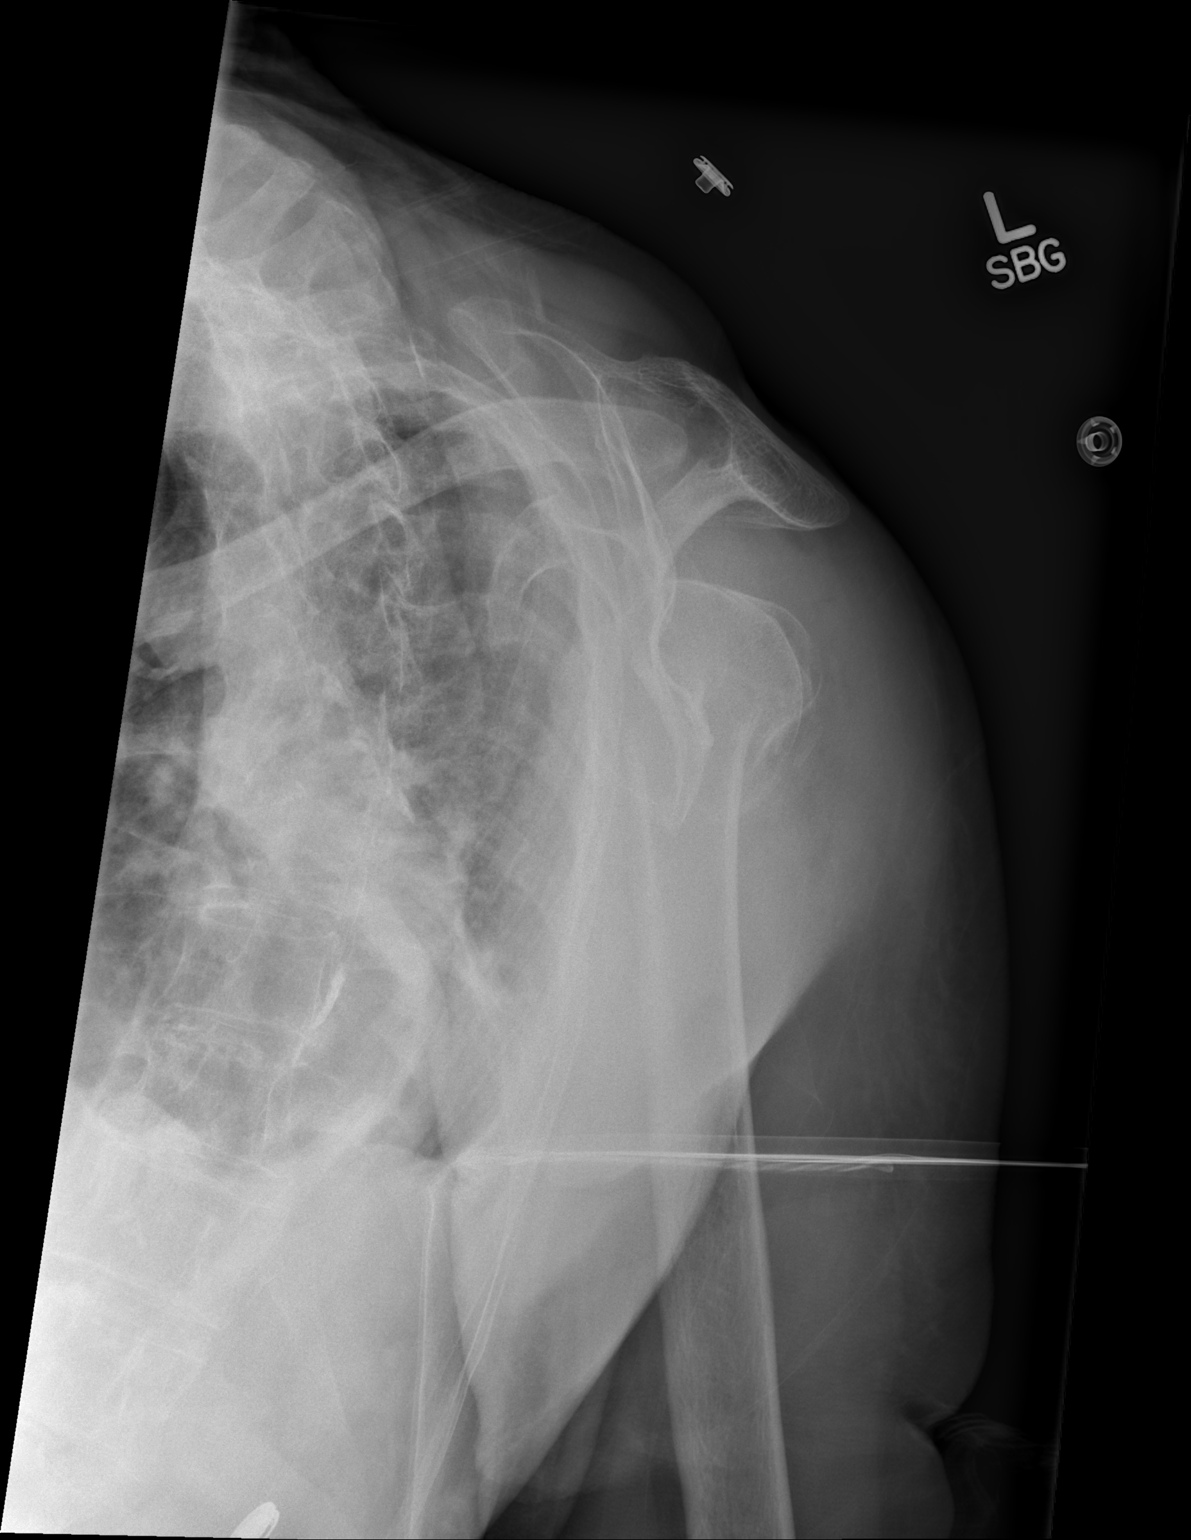

[x shoulder ap left (2 of 2)]
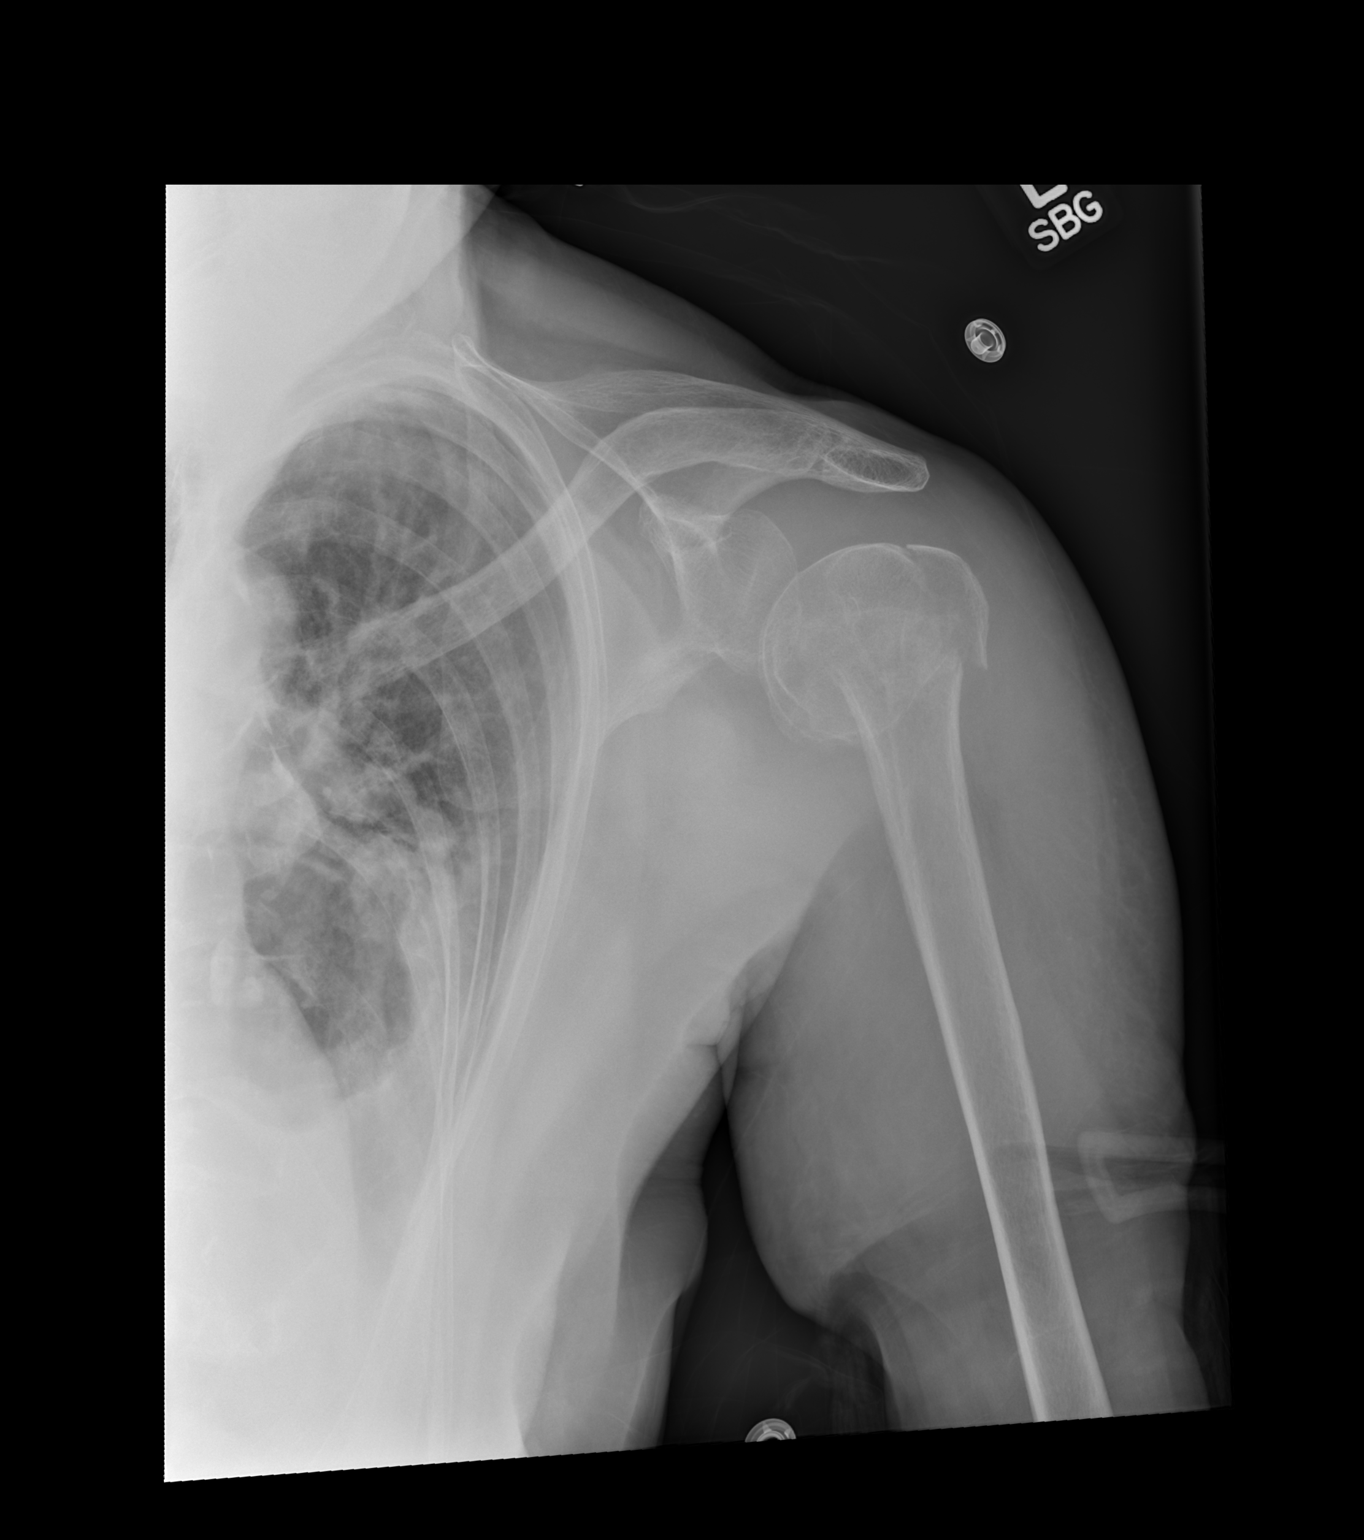

[2 of 2 positions shown; findings below may reference images not displayed]

FINDINGS: There is a comminuted fracture of the proximal humerus. There is a
transverse fracture across metaphysis. There is a fracture across
the base of the greater tuberosity. The shaft fracture component has
displaced superiorly, 11 mm, in relation to the humerus. There is
varus angulation between the humeral head and shaft components.

Humeral head is mildly subluxed inferiorly suggesting a joint
hemarthrosis. No dislocation.

AC joint is normally aligned.
IMPRESSION: 1. Mildly displaced comminuted fracture of the proximal left
humerus.

## 2017-07-22 DIAGNOSIS — R5381 Other malaise: Secondary | ICD-10-CM | POA: Insufficient documentation

## 2019-05-07 DIAGNOSIS — N1832 Chronic kidney disease, stage 3b: Secondary | ICD-10-CM | POA: Insufficient documentation

## 2021-04-05 DIAGNOSIS — F02B0 Dementia in other diseases classified elsewhere, moderate, without behavioral disturbance, psychotic disturbance, mood disturbance, and anxiety: Secondary | ICD-10-CM | POA: Insufficient documentation

## 2022-08-23 LAB — LAB REPORT - SCANNED
A1c: 6.5
Calcium: 9.9
EGFR: 39

## 2022-10-22 DIAGNOSIS — S61401D Unspecified open wound of right hand, subsequent encounter: Secondary | ICD-10-CM | POA: Diagnosis not present

## 2022-10-22 DIAGNOSIS — X58XXXD Exposure to other specified factors, subsequent encounter: Secondary | ICD-10-CM | POA: Diagnosis not present

## 2022-10-22 DIAGNOSIS — H6123 Impacted cerumen, bilateral: Secondary | ICD-10-CM | POA: Diagnosis not present

## 2022-10-22 DIAGNOSIS — S81801D Unspecified open wound, right lower leg, subsequent encounter: Secondary | ICD-10-CM | POA: Diagnosis not present

## 2022-10-22 DIAGNOSIS — F03911 Unspecified dementia, unspecified severity, with agitation: Secondary | ICD-10-CM | POA: Diagnosis not present

## 2022-10-22 DIAGNOSIS — K047 Periapical abscess without sinus: Secondary | ICD-10-CM | POA: Diagnosis not present

## 2022-10-22 DIAGNOSIS — F03918 Unspecified dementia, unspecified severity, with other behavioral disturbance: Secondary | ICD-10-CM | POA: Diagnosis not present

## 2022-10-29 ENCOUNTER — Telehealth: Payer: Self-pay

## 2022-10-29 DIAGNOSIS — I10 Essential (primary) hypertension: Secondary | ICD-10-CM

## 2022-10-30 ENCOUNTER — Telehealth: Payer: Self-pay | Admitting: *Deleted

## 2022-10-30 ENCOUNTER — Ambulatory Visit: Payer: Self-pay | Admitting: Licensed Clinical Social Worker

## 2022-10-30 NOTE — Progress Notes (Signed)
  Care Coordination   Note   10/30/2022 Name: [name] MRN: [id number] DOB: Sep 09, 1918  [name] is a 89 y.o. year old female who sees Margo Aye, Kathleene Hazel, MD for primary care. I reached out to [name] by phone today to offer care coordination services.  [name] was given information about Care Coordination services today including:   The Care Coordination services include support from the care team which includes your Nurse Coordinator, Clinical Social Worker, or Pharmacist.  The Care Coordination team is here to help remove barriers to the health concerns and goals most important to you. Care Coordination services are voluntary, and the patient may decline or stop services at any time by request to their care team member.   Care Coordination Consent Status: Patient agreed to services and verbal consent obtained.   Follow up plan:  Telephone appointment with care coordination team member scheduled for:  10/30/22  Encounter Outcome:  Patient Scheduled  [facility] Coordination Care Guide  Direct Dial: [phone]

## 2022-10-30 NOTE — Patient Outreach (Signed)
LCSW A. Felton Clinton spk with pt's son Mr. Jori Frerichs via phone. Mr. Lasota reports his sister was mother's primary caretaker until her untimely death 2022-11-25. Mr. Saccente is requesting assistance with home health care to help with personal hygiene needs of mother. Mr Sonnen is looking into long term care placement but understand this is going to take time and will need home health to bridge over until placement is completed. LCSW A  Dacey Milberger  contact [employer] Ref # [id number] to verify insurance coverage for home health. Per [employer] rep coverage is available and in ntwk providers are [facility] and [facility]. Information was provided to Mr. Chavers via phone. Advance directive packet will be sent to requested address  [address] [place] [address].

## 2022-11-06 ENCOUNTER — Ambulatory Visit: Payer: Self-pay | Admitting: Licensed Clinical Social Worker

## 2022-11-06 NOTE — Patient Outreach (Signed)
  Care Coordination   Follow Up Visit Note   11/06/2022 Name: MATSUKO ZIPKIN MRN: [id number] DOB: 01-30-1918  Tami Ribas Zaccardi is a 89 y.o. year old female who sees Margo Aye, Kathleene Hazel, MD for primary care. I spoke with  [name] son Millette Dowie  by phone today.  What matters to the patients health and wellness today?  Mr. Ohle is currently providing care to Ms. Soloway. Mr. Tiano reports no immediate concerns.     Goals Addressed   None     SDOH assessments and interventions completed:  No Completed during last visit.     Care Coordination Interventions:  Yes, provided      Follow up plan: No further intervention required.   Encounter Outcome:  Patient Visit Completed    Gwyndolyn Saxon MSW, LCSW Licensed Clinical Social Worker  [facility], Population Health Direct Dial: [phone]  Fax: [phone]

## 2022-11-08 DIAGNOSIS — R21 Rash and other nonspecific skin eruption: Secondary | ICD-10-CM | POA: Diagnosis not present

## 2022-11-08 DIAGNOSIS — Z79899 Other long term (current) drug therapy: Secondary | ICD-10-CM | POA: Diagnosis not present

## 2022-11-08 DIAGNOSIS — R002 Palpitations: Secondary | ICD-10-CM | POA: Diagnosis not present

## 2022-11-08 DIAGNOSIS — R600 Localized edema: Secondary | ICD-10-CM | POA: Diagnosis not present

## 2022-11-08 DIAGNOSIS — M7989 Other specified soft tissue disorders: Secondary | ICD-10-CM | POA: Diagnosis not present

## 2022-11-08 DIAGNOSIS — I129 Hypertensive chronic kidney disease with stage 1 through stage 4 chronic kidney disease, or unspecified chronic kidney disease: Secondary | ICD-10-CM | POA: Diagnosis not present

## 2022-11-08 DIAGNOSIS — N189 Chronic kidney disease, unspecified: Secondary | ICD-10-CM | POA: Diagnosis not present

## 2022-11-11 DIAGNOSIS — R600 Localized edema: Secondary | ICD-10-CM | POA: Diagnosis not present

## 2022-11-19 DIAGNOSIS — S81802A Unspecified open wound, left lower leg, initial encounter: Secondary | ICD-10-CM | POA: Diagnosis not present

## 2022-11-19 DIAGNOSIS — I87311 Chronic venous hypertension (idiopathic) with ulcer of right lower extremity: Secondary | ICD-10-CM | POA: Diagnosis not present

## 2022-11-19 DIAGNOSIS — S81801A Unspecified open wound, right lower leg, initial encounter: Secondary | ICD-10-CM | POA: Diagnosis not present

## 2022-11-24 DIAGNOSIS — L97819 Non-pressure chronic ulcer of other part of right lower leg with unspecified severity: Secondary | ICD-10-CM | POA: Diagnosis not present

## 2022-11-24 DIAGNOSIS — I87311 Chronic venous hypertension (idiopathic) with ulcer of right lower extremity: Secondary | ICD-10-CM | POA: Diagnosis not present

## 2022-11-24 DIAGNOSIS — L97829 Non-pressure chronic ulcer of other part of left lower leg with unspecified severity: Secondary | ICD-10-CM | POA: Diagnosis not present

## 2022-11-28 DIAGNOSIS — F419 Anxiety disorder, unspecified: Secondary | ICD-10-CM | POA: Diagnosis not present

## 2022-11-28 DIAGNOSIS — F03911 Unspecified dementia, unspecified severity, with agitation: Secondary | ICD-10-CM | POA: Diagnosis not present

## 2022-11-28 DIAGNOSIS — F0394 Unspecified dementia, unspecified severity, with anxiety: Secondary | ICD-10-CM | POA: Diagnosis not present

## 2022-12-03 DIAGNOSIS — F03911 Unspecified dementia, unspecified severity, with agitation: Secondary | ICD-10-CM | POA: Diagnosis not present

## 2022-12-03 DIAGNOSIS — H1033 Unspecified acute conjunctivitis, bilateral: Secondary | ICD-10-CM | POA: Diagnosis not present

## 2022-12-03 DIAGNOSIS — F419 Anxiety disorder, unspecified: Secondary | ICD-10-CM | POA: Diagnosis not present

## 2022-12-03 DIAGNOSIS — Q845 Enlarged and hypertrophic nails: Secondary | ICD-10-CM | POA: Diagnosis not present

## 2022-12-03 DIAGNOSIS — F03918 Unspecified dementia, unspecified severity, with other behavioral disturbance: Secondary | ICD-10-CM | POA: Diagnosis not present

## 2022-12-03 DIAGNOSIS — H109 Unspecified conjunctivitis: Secondary | ICD-10-CM | POA: Diagnosis not present

## 2022-12-03 DIAGNOSIS — H6121 Impacted cerumen, right ear: Secondary | ICD-10-CM | POA: Diagnosis not present

## 2022-12-05 LAB — LAB REPORT - SCANNED
A1c: 5.9
Calcium: 9.9
EGFR: 41

## 2022-12-16 DIAGNOSIS — M353 Polymyalgia rheumatica: Secondary | ICD-10-CM | POA: Diagnosis not present

## 2022-12-16 DIAGNOSIS — F5104 Psychophysiologic insomnia: Secondary | ICD-10-CM | POA: Diagnosis not present

## 2022-12-16 DIAGNOSIS — F03911 Unspecified dementia, unspecified severity, with agitation: Secondary | ICD-10-CM | POA: Diagnosis not present

## 2022-12-16 DIAGNOSIS — I10 Essential (primary) hypertension: Secondary | ICD-10-CM | POA: Diagnosis not present

## 2022-12-16 DIAGNOSIS — E119 Type 2 diabetes mellitus without complications: Secondary | ICD-10-CM | POA: Diagnosis not present

## 2022-12-16 DIAGNOSIS — S91302A Unspecified open wound, left foot, initial encounter: Secondary | ICD-10-CM | POA: Diagnosis not present

## 2022-12-16 DIAGNOSIS — N1831 Chronic kidney disease, stage 3a: Secondary | ICD-10-CM | POA: Diagnosis not present

## 2022-12-16 DIAGNOSIS — R600 Localized edema: Secondary | ICD-10-CM | POA: Diagnosis not present

## 2022-12-16 DIAGNOSIS — F419 Anxiety disorder, unspecified: Secondary | ICD-10-CM | POA: Diagnosis not present

## 2022-12-16 DIAGNOSIS — F03918 Unspecified dementia, unspecified severity, with other behavioral disturbance: Secondary | ICD-10-CM | POA: Diagnosis not present

## 2022-12-16 DIAGNOSIS — R197 Diarrhea, unspecified: Secondary | ICD-10-CM | POA: Diagnosis not present

## 2022-12-16 DIAGNOSIS — I129 Hypertensive chronic kidney disease with stage 1 through stage 4 chronic kidney disease, or unspecified chronic kidney disease: Secondary | ICD-10-CM | POA: Diagnosis not present

## 2022-12-23 DIAGNOSIS — I739 Peripheral vascular disease, unspecified: Secondary | ICD-10-CM | POA: Diagnosis not present

## 2022-12-23 DIAGNOSIS — M79674 Pain in right toe(s): Secondary | ICD-10-CM | POA: Diagnosis not present

## 2022-12-23 DIAGNOSIS — M79675 Pain in left toe(s): Secondary | ICD-10-CM | POA: Diagnosis not present

## 2022-12-23 DIAGNOSIS — B351 Tinea unguium: Secondary | ICD-10-CM | POA: Diagnosis not present

## 2023-01-02 DIAGNOSIS — R220 Localized swelling, mass and lump, head: Secondary | ICD-10-CM | POA: Diagnosis not present

## 2023-01-02 DIAGNOSIS — S0990XA Unspecified injury of head, initial encounter: Secondary | ICD-10-CM | POA: Diagnosis not present

## 2023-01-02 DIAGNOSIS — M79671 Pain in right foot: Secondary | ICD-10-CM | POA: Diagnosis not present

## 2023-01-02 DIAGNOSIS — S92414A Nondisplaced fracture of proximal phalanx of right great toe, initial encounter for closed fracture: Secondary | ICD-10-CM | POA: Diagnosis not present

## 2023-01-02 DIAGNOSIS — Z79899 Other long term (current) drug therapy: Secondary | ICD-10-CM | POA: Diagnosis not present

## 2023-01-02 DIAGNOSIS — M85871 Other specified disorders of bone density and structure, right ankle and foot: Secondary | ICD-10-CM | POA: Diagnosis not present

## 2023-01-02 DIAGNOSIS — N189 Chronic kidney disease, unspecified: Secondary | ICD-10-CM | POA: Diagnosis not present

## 2023-01-02 DIAGNOSIS — S199XXA Unspecified injury of neck, initial encounter: Secondary | ICD-10-CM | POA: Diagnosis not present

## 2023-01-02 DIAGNOSIS — Z888 Allergy status to other drugs, medicaments and biological substances status: Secondary | ICD-10-CM | POA: Diagnosis not present

## 2023-01-02 DIAGNOSIS — S92401A Displaced unspecified fracture of right great toe, initial encounter for closed fracture: Secondary | ICD-10-CM | POA: Diagnosis not present

## 2023-01-02 DIAGNOSIS — W19XXXA Unspecified fall, initial encounter: Secondary | ICD-10-CM | POA: Diagnosis not present

## 2023-01-02 DIAGNOSIS — I129 Hypertensive chronic kidney disease with stage 1 through stage 4 chronic kidney disease, or unspecified chronic kidney disease: Secondary | ICD-10-CM | POA: Diagnosis not present

## 2023-01-02 DIAGNOSIS — M79674 Pain in right toe(s): Secondary | ICD-10-CM | POA: Diagnosis not present

## 2023-01-02 DIAGNOSIS — Z66 Do not resuscitate: Secondary | ICD-10-CM | POA: Diagnosis not present

## 2023-01-02 DIAGNOSIS — S0083XA Contusion of other part of head, initial encounter: Secondary | ICD-10-CM | POA: Diagnosis not present

## 2023-01-02 DIAGNOSIS — S0181XA Laceration without foreign body of other part of head, initial encounter: Secondary | ICD-10-CM | POA: Diagnosis not present

## 2023-01-02 DIAGNOSIS — Z91041 Radiographic dye allergy status: Secondary | ICD-10-CM | POA: Diagnosis not present

## 2023-01-02 DIAGNOSIS — S29009A Unspecified injury of muscle and tendon of unspecified wall of thorax, initial encounter: Secondary | ICD-10-CM | POA: Diagnosis not present

## 2023-02-18 ENCOUNTER — Ambulatory Visit: Payer: Self-pay | Admitting: Internal Medicine

## 2023-02-18 NOTE — Telephone Encounter (Signed)
Nurse at facility wanted apt sooner for new patient in their home.  No apt available.  Patient to go to [facility] for nausea.  Pt is 89 years old and nurse was not by patient.  Request to call came from [facility] for apt.

## 2023-04-04 ENCOUNTER — Ambulatory Visit (INDEPENDENT_AMBULATORY_CARE_PROVIDER_SITE_OTHER): Payer: Self-pay | Admitting: Family Medicine

## 2023-04-04 ENCOUNTER — Encounter: Payer: Self-pay | Admitting: Family Medicine

## 2023-04-04 VITALS — BP 135/69 | HR 78 | Wt 72.0 lb

## 2023-04-04 DIAGNOSIS — R7301 Impaired fasting glucose: Secondary | ICD-10-CM | POA: Diagnosis not present

## 2023-04-04 DIAGNOSIS — E538 Deficiency of other specified B group vitamins: Secondary | ICD-10-CM | POA: Diagnosis not present

## 2023-04-04 DIAGNOSIS — E559 Vitamin D deficiency, unspecified: Secondary | ICD-10-CM

## 2023-04-04 DIAGNOSIS — Z136 Encounter for screening for cardiovascular disorders: Secondary | ICD-10-CM | POA: Diagnosis not present

## 2023-04-04 DIAGNOSIS — R600 Localized edema: Secondary | ICD-10-CM

## 2023-04-04 DIAGNOSIS — I10 Essential (primary) hypertension: Secondary | ICD-10-CM

## 2023-04-04 DIAGNOSIS — M159 Polyosteoarthritis, unspecified: Secondary | ICD-10-CM | POA: Diagnosis not present

## 2023-04-04 DIAGNOSIS — E038 Other specified hypothyroidism: Secondary | ICD-10-CM

## 2023-04-04 MED ORDER — LORATADINE 10 MG PO TABS: 10.0000 mg | ORAL_TABLET | Freq: Every day | ORAL | 11 refills | Status: DC

## 2023-04-04 MED ORDER — ONDANSETRON HCL 4 MG PO TABS: 4.0000 mg | ORAL_TABLET | Freq: Two times a day (BID) | ORAL | 0 refills | Status: DC | PRN

## 2023-04-04 NOTE — Progress Notes (Signed)
 New Patient Office Visit   Subjective   Patient ID: [name], female    DOB: 07-22-1918  Age: 89 y.o. MRN: [id number]  CC:  Chief Complaint  Patient presents with   Establish Care    Physical, trouble w/ nausea although appetite is normal and she likes to snack.  Trouble hearing out of rt ear, Swelling and redness around eyes Chronic falls   Swelling in legs, taking fluid pill     HPI [name] 89 year old female, presents to establish care. She  has a past medical history of Cardiac arrhythmia, Cholelithiasis, DJD (degenerative joint disease), Esophageal stricture, Fibromyalgia, Hypertension, Kidney disease, Osteoarthritis, Osteoporosis, Renal calculi, Sinusitis, and Takotsubo cardiomyopathy (2009).For the details of today's visit, please refer to assessment and plan.   HPI    Outpatient Encounter Medications as of 04/04/2023  Medication Sig   albuterol (PROVENTIL HFA;VENTOLIN HFA) 108 (90 Base) MCG/ACT inhaler Inhale 1 puff into the lungs every 6 (six) hours as needed for wheezing or shortness of breath.   furosemide (LASIX) 20 MG tablet Take 1 tablet (20 mg total) by mouth daily as needed.   ondansetron (ZOFRAN) 4 MG tablet Take 1 tablet (4 mg total) by mouth every 12 (twelve) hours as needed for nausea or vomiting.   [DISCONTINUED] lisinopril (PRINIVIL,ZESTRIL) 5 MG tablet Take 10 mg by mouth daily.   [DISCONTINUED] mirtazapine (REMERON SOL-TAB) 15 MG disintegrating tablet Take 15 mg by mouth at bedtime.   [DISCONTINUED] ondansetron (ZOFRAN) 4 MG tablet Take 4 mg by mouth every 8 (eight) hours as needed for nausea.    [DISCONTINUED] Potassium Chloride 10 % SOLN Take 15 mLs by mouth 1 day or 1 dose.   feeding supplement (BOOST / RESOURCE BREEZE) LIQD Take 1 Container by mouth 2 (two) times daily between meals. (Patient not taking: Reported on 04/04/2023)   LORazepam (ATIVAN) 0.5 MG tablet Take 0.5 mg by mouth every 8 (eight) hours as needed for sleep. (Patient  not taking: Reported on 04/04/2023)   QUEtiapine (SEROQUEL) 25 MG tablet Take 25 mg by mouth at bedtime. (Patient not taking: Reported on 04/04/2023)   [DISCONTINUED] ALPRAZolam (XANAX) 0.25 MG tablet Take 0.25 mg by mouth 2 (two) times daily as needed for anxiety. (Patient not taking: Reported on 04/04/2023)   [DISCONTINUED] Calcium 1200-1000 MG-UNIT CHEW Chew by mouth. 1 chew daily (doesn't take everyday)  (Patient not taking: Reported on 04/04/2023)   [DISCONTINUED] cholecalciferol (VITAMIN D) 1000 units tablet Take 1,000 Units by mouth daily. (Patient not taking: Reported on 04/04/2023)   [DISCONTINUED] dicyclomine (BENTYL) 10 MG capsule Take 10 mg by mouth daily. (Patient not taking: Reported on 04/04/2023)   [DISCONTINUED] diphenoxylate-atropine (LOMOTIL) 2.5-0.025 MG tablet Take 1 tablet by mouth 4 (four) times daily as needed for diarrhea or loose stools. (Patient not taking: Reported on 04/04/2023)   [DISCONTINUED] glucosamine-chondroitin 500-400 MG tablet Take 1 tablet by mouth daily. (Patient not taking: Reported on 04/04/2023)   [DISCONTINUED] HYDROcodone-acetaminophen (NORCO/VICODIN) 5-325 MG tablet Take 1 tablet by mouth every 4 (four) hours as needed for moderate pain or severe pain. (Patient not taking: Reported on 04/04/2023)   [DISCONTINUED] loratadine (CLARITIN) 10 MG tablet Take 1 tablet (10 mg total) by mouth daily. (Patient not taking: Reported on 04/04/2023)   [DISCONTINUED] metoprolol succinate (TOPROL-XL) 25 MG 24 hr tablet Take 12.5 mg by mouth daily.  (Patient not taking: Reported on 04/04/2023)   [DISCONTINUED] Multiple Vitamins-Minerals (EYE-VITES PO) Take by mouth daily. Take 1 tablet by mouth  daily  (Patient not taking: Reported on 04/04/2023)   [DISCONTINUED] pantoprazole (PROTONIX) 40 MG tablet Take 1 tablet (40 mg total) by mouth daily. (Patient not taking: Reported on 04/04/2023)   [DISCONTINUED] polyethylene glycol (MIRALAX / GLYCOLAX) packet Take 17 g by mouth daily as needed for  moderate constipation. (Patient not taking: Reported on 04/04/2023)   [DISCONTINUED] predniSONE (DELTASONE) 5 MG tablet Take 5 mg by mouth daily.   (Patient not taking: Reported on 04/04/2023)   [DISCONTINUED] promethazine (PHENERGAN) 25 MG tablet Take 25 mg by mouth every 6 (six) hours as needed for nausea or vomiting. (Patient not taking: Reported on 04/04/2023)   [DISCONTINUED] vitamin B-12 (CYANOCOBALAMIN) 100 MCG tablet Take 100 mcg by mouth daily. (Patient not taking: Reported on 04/04/2023)   No facility-administered encounter medications on file as of 04/04/2023.    Past Surgical History:  Procedure Laterality Date   ABDOMINAL HYSTERECTOMY     CHOLECYSTECTOMY     EYE SURGERY     OPEN REDUCTION INTERNAL FIXATION (ORIF) DISTAL RADIAL FRACTURE Left 04/14/2016   Procedure: OPEN REDUCTION INTERNAL FIXATION (ORIF) DISTAL RADIAL FRACTURE;  Surgeon: Bradly Bienenstock, MD;  Location: [facility] OR;  Service: Orthopedics;  Laterality: Left;    Review of Systems  Constitutional:  Negative for chills and fever.  Respiratory:  Negative for shortness of breath.   Cardiovascular:  Negative for chest pain.  Gastrointestinal:  Negative for abdominal pain.  Genitourinary:  Negative for dysuria.  Neurological:  Negative for headaches.      Objective    BP 135/69   Pulse 78   Wt 72 lb 0.6 oz (32.7 kg)   SpO2 93%   BMI 13.18 kg/m   Physical Exam Vitals reviewed.  Constitutional:      General: She is not in acute distress.    Appearance: Normal appearance. She is not ill-appearing, toxic-appearing or diaphoretic.  HENT:     Head: Normocephalic.     Right Ear: There is impacted cerumen.     Left Ear: There is impacted cerumen.     Nose: Congestion present.  Eyes:     General:        Right eye: No discharge.        Left eye: No discharge.     Conjunctiva/sclera: Conjunctivae normal.     Pupils: Pupils are equal, round, and reactive to light.  Cardiovascular:     Rate and Rhythm: Normal rate.      Pulses: Normal pulses.     Heart sounds: Normal heart sounds.  Pulmonary:     Effort: Pulmonary effort is normal. No respiratory distress.     Breath sounds: Normal breath sounds.  Abdominal:     General: Bowel sounds are normal.     Palpations: Abdomen is soft.     Tenderness: There is no abdominal tenderness. There is no right CVA tenderness, left CVA tenderness or guarding.  Skin:    General: Skin is warm and dry.     Capillary Refill: Capillary refill takes less than 2 seconds.  Neurological:     Mental Status: She is alert.     Coordination: Coordination normal.     Gait: Gait abnormal.  Psychiatric:        Mood and Affect: Mood normal.        Behavior: Behavior normal.       Assessment & Plan:  Vitamin D deficiency -     VITAMIN D 25 Hydroxy (Vit-D Deficiency, Fractures)  TSH (thyroid-stimulating hormone deficiency) -  TSH + free T4  Encounter for screening for cardiovascular disorders Assessment & Plan: A comprehensive physical examination was completed, and necessary labs were ordered. The patient received counseling on exercise and nutrition. BMI was assessed and discussed Advise for heart health, focus on: Eat more fruits and vegetables: Aim for a variety of colors. Choose whole grains: Brown rice, oats, and whole-wheat bread. Limit unhealthy fats: Avoid trans fats; use olive or avocado oil instead. Include lean proteins: Opt for fish, chicken, beans, and legumes. Reduce sodium: Limit processed foods and add less salt. Stay hydrated: Drink plenty of water. Exercise regularly: Aim for at least 30 minutes of moderate exercise, like walking or cycling, 5 days a week.    Orders: -     Lipid panel -     CMP14+EGFR -     CBC with Differential/Platelet  IFG (impaired fasting glucose) -     Hemoglobin A1c  Localized edema  Vitamin B12 deficiency -     Vitamin B12  Essential hypertension Assessment & Plan: Vitals:   04/04/23 1426  BP: 135/69  Blood  pressure controlled. Patient son states she is currently not taking any medication. Labs ordered. Discussed with  patient to monitor their blood pressure regularly and maintain a heart-healthy diet rich in fruits, vegetables, whole grains, and low-fat dairy, while reducing sodium intake to less than 2,300 mg per day. Regular physical activity, such as 30 minutes of moderate exercise most days of the week, will help lower blood pressure and improve overall cardiovascular health. Avoiding smoking, limiting alcohol consumption, and managing stress. Seek emergency care if your blood pressure is (over 180/100) or you experience chest pain, shortness of breath, or sudden vision changes.Patient verbalizes understanding regarding plan of care and all questions answered.    Generalized osteoarthritis of multiple sites Assessment & Plan: Can take tylenol ol PRN for pain Discussed maintain engage in regular low-impact exercises like walking or swimming to improve flexibility and strength. Apply heat or cold packs for pain relief and use assistive devices (e.g., braces or canes) if needed to ease joint strain.   Other orders -     Ondansetron HCl; Take 1 tablet (4 mg total) by mouth every 12 (twelve) hours as needed for nausea or vomiting.  Dispense: 20 tablet; Refill: 0    Return in about 6 months (around 10/05/2023), or if symptoms worsen or fail to improve, for chronic follow-up.   Cruzita Lederer Newman Nip, FNP

## 2023-04-04 NOTE — Addendum Note (Signed)
 Addended by: Rica Records on: 04/04/2023 03:31 PM   Modules accepted: Orders

## 2023-04-04 NOTE — Assessment & Plan Note (Signed)
 A comprehensive physical examination was completed, and necessary labs were ordered. The patient received counseling on exercise and nutrition. BMI was assessed and discussed Advise for heart health, focus on: Eat more fruits and vegetables: Aim for a variety of colors. Choose whole grains: Brown rice, oats, and whole-wheat bread. Limit unhealthy fats: Avoid trans fats; use olive or avocado oil instead. Include lean proteins: Opt for fish, chicken, beans, and legumes. Reduce sodium: Limit processed foods and add less salt. Stay hydrated: Drink plenty of water. Exercise regularly: Aim for at least 30 minutes of moderate exercise, like walking or cycling, 5 days a week.

## 2023-04-04 NOTE — Assessment & Plan Note (Signed)
 Can take tylenol ol PRN for pain Discussed maintain engage in regular low-impact exercises like walking or swimming to improve flexibility and strength. Apply heat or cold packs for pain relief and use assistive devices (e.g., braces or canes) if needed to ease joint strain.

## 2023-04-04 NOTE — Patient Instructions (Signed)

## 2023-04-04 NOTE — Assessment & Plan Note (Signed)
 Vitals:   04/04/23 1426  BP: 135/69  Blood pressure controlled. Patient son states she is currently not taking any medication. Labs ordered. Discussed with  patient to monitor their blood pressure regularly and maintain a heart-healthy diet rich in fruits, vegetables, whole grains, and low-fat dairy, while reducing sodium intake to less than 2,300 mg per day. Regular physical activity, such as 30 minutes of moderate exercise most days of the week, will help lower blood pressure and improve overall cardiovascular health. Avoiding smoking, limiting alcohol consumption, and managing stress. Seek emergency care if your blood pressure is (over 180/100) or you experience chest pain, shortness of breath, or sudden vision changes.Patient verbalizes understanding regarding plan of care and all questions answered.

## 2023-04-05 LAB — CBC WITH DIFFERENTIAL/PLATELET

## 2023-04-06 LAB — CBC WITH DIFFERENTIAL/PLATELET
Basos: 1 %
EOS (ABSOLUTE): 0.1 10*3/uL (ref 0.0–0.2)
Eos: 1 %
Hematocrit: 40.5 % (ref 34.0–46.6)
Hemoglobin: 13 g/dL (ref 11.1–15.9)
Immature Granulocytes: 0 %
Immature Granulocytes: 0 10*3/uL (ref 0.0–0.1)
Lymphs: 19 %
MCH: 29.9 pg (ref 26.6–33.0)
MCHC: 32.1 g/dL (ref 31.5–35.7)
MCV: 93 fL (ref 79–97)
Monocytes Absolute: 0.1 10*3/uL (ref 0.0–0.4)
Monocytes Absolute: 0.7 10*3/uL (ref 0.1–0.9)
Monocytes: 9 %
Neutrophils Absolute: 1.6 10*3/uL (ref 0.7–3.1)
Neutrophils Absolute: 5.7 10*3/uL (ref 1.4–7.0)
Neutrophils: 70 %
Platelets: 205 10*3/uL (ref 150–450)
RBC: 4.35 x10E6/uL (ref 3.77–5.28)
RDW: 13.7 % (ref 11.7–15.4)
WBC: 8.2 10*3/uL (ref 3.4–10.8)

## 2023-04-06 LAB — HEMOGLOBIN A1C
Est. average glucose Bld gHb Est-mCnc: 131 mg/dL
Hgb A1c MFr Bld: 6.2 % — ABNORMAL HIGH (ref 4.8–5.6)

## 2023-04-06 LAB — LIPID PANEL
Chol/HDL Ratio: 3 ratio (ref 0.0–4.4)
Cholesterol, Total: 192 mg/dL (ref 100–199)
HDL: 64 mg/dL (ref >39)
LDL Chol Calc (NIH): 115 mg/dL — ABNORMAL HIGH (ref 0–99)
Triglycerides: 69 mg/dL (ref 0–149)
VLDL Cholesterol Cal: 13 mg/dL (ref 5–40)

## 2023-04-06 LAB — CMP14+EGFR
ALT: 11 IU/L (ref 0–32)
AST: 21 IU/L (ref 0–40)
Albumin: 3.9 g/dL (ref 3.6–4.6)
Alkaline Phosphatase: 123 IU/L — ABNORMAL HIGH (ref 44–121)
BUN/Creatinine Ratio: 23 (ref 12–28)
BUN: 27 mg/dL (ref 10–36)
Bilirubin Total: 0.2 mg/dL (ref 0.0–1.2)
CO2: 27 mmol/L (ref 20–29)
Calcium: 9.5 mg/dL (ref 8.7–10.3)
Chloride: 100 mmol/L (ref 96–106)
Creatinine, Ser: 1.2 mg/dL — ABNORMAL HIGH (ref 0.57–1.00)
Globulin, Total: 3.8 g/dL (ref 1.5–4.5)
Glucose: 95 mg/dL (ref 70–99)
Potassium: 5.2 mmol/L (ref 3.5–5.2)
Sodium: 139 mmol/L (ref 134–144)
Total Protein: 7.7 g/dL (ref 6.0–8.5)
eGFR: 40 mL/min/{1.73_m2} — ABNORMAL LOW (ref >59)

## 2023-04-06 LAB — TSH+FREE T4
Free T4: 0.92 ng/dL (ref 0.82–1.77)
TSH: 3.51 u[IU]/mL (ref 0.450–4.500)

## 2023-04-06 LAB — VITAMIN D 25 HYDROXY (VIT D DEFICIENCY, FRACTURES): Vit D, 25-Hydroxy: 34.8 ng/mL (ref 30.0–100.0)

## 2023-04-09 NOTE — Progress Notes (Signed)
 Please inform patient,  Hemoglobin A1c 6.2 indicates prediabetes - no medication intervention just lifestyle changes   Pre-Diabetes Diet  Eat More:  Whole grains: Oats, quinoa, brown rice. Vegetables: Leafy greens, broccoli, green beans. Fruits: Low-sugar like berries, apples. Lean proteins: Chicken, fish, beans, eggs. Healthy fats: Nuts, seeds, avocado, olive oil.  Limit:  Refined carbs: White bread, pastries. Sugary foods: Soda, candy, desserts. Fried and fatty foods.  Tips:  Eat balanced meals with portion control. Stay hydrated (water over sugary drinks). Pair with regular exercise (e.g., walking). Example: Grilled chicken, quinoa, and steamed broccoli.  Combine diet with regular physical activity (e.g., 30 minutes of walking daily or 5 times per week.)      Cholesterol levels elevated, start lifestyle modifications and follow diet low in saturated fat.  Diet to Lower Cholesterol Eat More: Oats, beans, and lentils: High in soluble fiber. Fatty fish: Salmon, tuna (rich in omega-3s). Nuts and seeds: Almonds, walnuts, flaxseeds. Fruits and vegetables: Apples, berries, leafy greens. Healthy fats: Olive oil, avocado. Limit: Saturated fats: Butter, cream, fatty meats. Trans fats: Fried foods, processed snacks. Sugar and refined carbs: Sweets, white bread. Focus on whole foods, healthy fats, and fiber to improve heart health! Maintain an exercise routine 3 to 5 days a week for a minimum total of 150 minutes.      Your eGFR levels show that your kidney function is lower. Here are some important steps to take:  Control Your Blood Pressure: Aim to keep your blood pressure below 130/80. Continue taking your blood pressure medications daily.  Manage Your prediabetes: Focus on a healthy diet and regular exercise.  Keep Cholesterol in Check: This helps protect your blood vessels from further damage.  Pain Management: Avoid NSAIDs (like ibuprofen) and use Tylenol  instead for pain relief.  Kidney-Friendly Diet:  Eat plenty of vegetables like cauliflower, onions, eggplant, and turnips. Choose low-sodium options and limit protein to lean meats (like poultry and fish), eggs, and unsalted seafood. Avoid fatty foods and limit smoking and alcohol. Stay Active: Aim for at least 150 minutes of exercise each week.

## 2023-04-10 LAB — VITAMIN B12: Vitamin B-12: 519 pg/mL (ref 232–1245)

## 2023-04-10 LAB — SPECIMEN STATUS REPORT

## 2023-04-17 ENCOUNTER — Ambulatory Visit: Payer: Self-pay | Admitting: Internal Medicine

## 2023-07-08 ENCOUNTER — Other Ambulatory Visit: Payer: Self-pay | Admitting: Family Medicine

## 2023-07-28 ENCOUNTER — Telehealth: Payer: Self-pay

## 2023-07-28 NOTE — Telephone Encounter (Signed)
 Copied from CRM [id number]. Topic: MyChart - Other >> Jul 28, 2023  8:22 AM Harlene ORN wrote: Reason for CRM: Son called about his MyChart account. wants to set up another account on his mother's computer. unfortunately, the sign in on Mychart would not accept his or the mother's information. The daughter used to handle the MyChart accounts until she passed. Please call back and advise.

## 2023-10-07 ENCOUNTER — Telehealth: Payer: Self-pay

## 2023-10-07 NOTE — Telephone Encounter (Signed)
 Copied from CRM [id number]. Topic: General - Other >> Oct 07, 2023 12:32 PM Emylou G wrote: Reason for CRM: Please call Jenkins Lieu [phone] - trying to reschedule her appt.. but unable to find one for her 6 mo ckup-- nothing available til January

## 2023-10-08 NOTE — Telephone Encounter (Signed)
 Pt moved up ok per provider

## 2023-10-09 ENCOUNTER — Ambulatory Visit: Admitting: Nurse Practitioner

## 2023-10-09 ENCOUNTER — Ambulatory Visit: Admitting: Family Medicine

## 2023-10-13 ENCOUNTER — Ambulatory Visit (INDEPENDENT_AMBULATORY_CARE_PROVIDER_SITE_OTHER)

## 2023-10-13 DIAGNOSIS — R7301 Impaired fasting glucose: Secondary | ICD-10-CM | POA: Diagnosis not present

## 2023-10-13 DIAGNOSIS — Z23 Encounter for immunization: Secondary | ICD-10-CM | POA: Diagnosis not present

## 2023-10-13 DIAGNOSIS — E876 Hypokalemia: Secondary | ICD-10-CM

## 2023-10-13 DIAGNOSIS — R600 Localized edema: Secondary | ICD-10-CM | POA: Diagnosis not present

## 2023-10-13 DIAGNOSIS — G301 Alzheimer's disease with late onset: Secondary | ICD-10-CM

## 2023-10-13 DIAGNOSIS — I10 Essential (primary) hypertension: Secondary | ICD-10-CM | POA: Diagnosis not present

## 2023-10-13 DIAGNOSIS — E538 Deficiency of other specified B group vitamins: Secondary | ICD-10-CM | POA: Insufficient documentation

## 2023-10-13 DIAGNOSIS — F02B0 Dementia in other diseases classified elsewhere, moderate, without behavioral disturbance, psychotic disturbance, mood disturbance, and anxiety: Secondary | ICD-10-CM

## 2023-10-13 MED ORDER — POTASSIUM CHLORIDE 20 MEQ/15ML (10%) PO SOLN: 20.0000 meq | Freq: Every day | ORAL | 5 refills | Status: AC

## 2023-10-13 MED ORDER — LORATADINE 10 MG PO TABS: 10.0000 mg | ORAL_TABLET | Freq: Every day | ORAL | 11 refills | Status: AC

## 2023-10-13 MED ORDER — FUROSEMIDE 20 MG PO TABS: 20.0000 mg | ORAL_TABLET | Freq: Every day | ORAL | 5 refills | Status: AC

## 2023-10-13 MED ORDER — QUETIAPINE FUMARATE 25 MG PO TABS
12.5000 mg | ORAL_TABLET | Freq: Every day | ORAL | 3 refills | Status: AC
Start: 2023-10-13 — End: ?

## 2023-10-13 NOTE — Assessment & Plan Note (Signed)
 Blood pressure controlled.  Will have her continue with current dose of Lasix  and continue with potassium supplementation. Update labs today.

## 2023-10-13 NOTE — Assessment & Plan Note (Signed)
 Chronic edema managed with furosemide  as needed. - Check kidney function and potassium levels to assess furosemide  use.

## 2023-10-13 NOTE — Assessment & Plan Note (Signed)
Recheck A1c level today

## 2023-10-13 NOTE — Assessment & Plan Note (Signed)
 Recheck BMP today.

## 2023-10-13 NOTE — Progress Notes (Signed)
 Established Patient Office Visit  Subjective   Patient ID: [name], female    DOB: 04/14/1918  Age: 89 y.o. MRN: [id number]  Chief Complaint  Patient presents with   Medical Management of Chronic Issues    Pt here for a 6 month follow up    HPI Discussed the use of AI scribe software for clinical note transcription with the patient, who gave verbal consent to proceed.  History of Present Illness   [name] is a 89 year old female who presents for a routine follow-up visit. She is accompanied by her son, Marcey.  Chronic rhinorrhea - Continuous runny nose for several years - No associated symptoms such as congestion, sneezing, or sinus pain  Lower extremity edema and skin ulceration - Significant swelling and open sores on lower legs, previously described as 'terrible' and 'like open sores' - Furosemide  taken as needed for swelling  Hearing impairment - Hearing difficulties, particularly in one ear - Wax buildup suspected but cleaning did not improve hearing  Nutritional status and weight - Eating well but not gaining weight - Good appetite - Spends most of her time sitting on the couch  Neurocognitive and emotional status - History of dementia symptoms, which have improved after medication changes - Quetiapine previously prescribed to help manage emotional state after her sister's passing  Medication history - Current medications include potassium, loratadine , and furosemide  - Previously on multiple medications including steroids, blood pressure medication, and heart medication, which were discontinued after her sister's passing      Patient Active Problem List   Diagnosis Date Noted   IFG (impaired fasting glucose) 10/13/2023   Vitamin B12 deficiency 10/13/2023   Localized edema 10/13/2023   Encounter for screening for cardiovascular disorders 04/04/2023   Moderate late onset Alzheimer's dementia without behavioral disturbance, psychotic  disturbance, mood disturbance, or anxiety (HCC) 04/05/2021   Stage 3b chronic kidney disease (HCC) 05/07/2019   Malaise 07/22/2017   DDD (degenerative disc disease), lumbar 01/21/2017   Palliative care patient 10/31/2016   Postural kyphosis of thoracolumbar region 10/31/2016   Thoracogenic scoliosis of thoracolumbar region 10/31/2016   Chronic pain disorder 10/31/2016   Malnutrition of moderate degree 04/18/2016   Wrist fracture 04/14/2016   Open fracture of left wrist 04/13/2016   Moderate protein-calorie malnutrition 08/22/2015   Colitis 08/21/2015   Generalized osteoarthritis of multiple sites 03/30/2013   Polymyalgia rheumatica 03/30/2013   Anxiety state 03/30/2013   Herpes simplex 02/01/2013   Esophageal reflux 01/11/2013   Disorder of liver 10/29/2012   Abnormal loss of weight 10/26/2012   Myalgia 07/23/2012   Renal failure 07/23/2012   Headache 06/20/2012   Hypokalemia 06/20/2012   Irritable colon 06/20/2012   Osteoporosis 06/20/2012   Abnormal glucose 06/20/2012   Asthma 06/20/2012   PURE HYPERCHOLESTEROLEMIA 10/25/2008   Essential hypertension 10/25/2008   INTERMEDIATE CORONARY SYNDROME 10/25/2008   CARDIOMYOPATHY 10/25/2008   DIAPHRAGMAT HERN W/O MENTION OBSTRUCTION/GANGREN 10/25/2008   RHEUMATOID ARTHRITIS 10/25/2008   OTHER MALAISE AND FATIGUE 10/25/2008   SHORTNESS OF BREATH 10/25/2008   COUGH 10/25/2008    ROS    Objective:     There were no vitals taken for this visit. BP Readings from Last 3 Encounters:  04/04/23 135/69  04/21/16 (!) 110/97  04/12/16 140/72   Wt Readings from Last 3 Encounters:  04/04/23 72 lb 0.6 oz (32.7 kg)  04/21/16 93 lb 0.6 oz (42.2 kg)  04/12/16 93 lb (42.2 kg)      Physical Exam  Vitals and nursing note reviewed. Exam conducted with a chaperone present (Son Marcey is with her today).  Constitutional:      Appearance: Normal appearance.  HENT:     Head: Normocephalic.     Right Ear: Tympanic membrane, ear canal and  external ear normal.     Left Ear: Tympanic membrane, ear canal and external ear normal.     Nose: Nose normal.     Mouth/Throat:     Mouth: Mucous membranes are moist.     Pharynx: Oropharynx is clear.  Eyes:     Extraocular Movements: Extraocular movements intact.     Pupils: Pupils are equal, round, and reactive to light.  Cardiovascular:     Rate and Rhythm: Normal rate and regular rhythm.  Pulmonary:     Effort: Pulmonary effort is normal.     Breath sounds: Normal breath sounds.  Musculoskeletal:     Cervical back: Normal range of motion and neck supple.  Neurological:     Mental Status: She is alert and oriented to person, place, and time.  Psychiatric:        Attention and Perception: Attention normal.        Mood and Affect: Mood normal.        Speech: Speech normal.        Behavior: Behavior normal.        Thought Content: Thought content normal.      No results found for any visits on 10/13/23.    The ASCVD Risk score (Arnett DK, et al., 2019) failed to calculate for the following reasons:   The 2019 ASCVD risk score is only valid for ages 92 to 66    Assessment & Plan:   Problem List Items Addressed This Visit       Cardiovascular and Mediastinum   Essential hypertension   Blood pressure controlled.  Will have her continue with current dose of Lasix  and continue with potassium supplementation. Update labs today.      Relevant Medications   furosemide  (LASIX ) 20 MG tablet     Endocrine   IFG (impaired fasting glucose) - Primary   Recheck A1c level today      Relevant Orders   Basic Metabolic Panel (BMET)   HgB A1c     Nervous and Auditory   Moderate late onset Alzheimer's dementia without behavioral disturbance, psychotic disturbance, mood disturbance, or anxiety (HCC)   Quetiapine dose reduced due to weight loss and sedation. - Reduce quetiapine dose to half a tablet.      Relevant Medications   QUEtiapine (SEROQUEL) 25 MG tablet      Other   Hypokalemia   Recheck BMP today      Relevant Medications   potassium chloride  20 MEQ/15ML (10%) SOLN   Other Relevant Orders   Basic Metabolic Panel (BMET)   Localized edema   Chronic edema managed with furosemide  as needed. - Check kidney function and potassium levels to assess furosemide  use.      Relevant Medications   furosemide  (LASIX ) 20 MG tablet   Other Visit Diagnoses       Need for influenza vaccination       Relevant Orders   Flu vaccine HIGH DOSE PF(Fluzone Trivalent) (Completed)       No follow-ups on file.    Leita Longs, FNP

## 2023-10-13 NOTE — Assessment & Plan Note (Signed)
 Quetiapine dose reduced due to weight loss and sedation. - Reduce quetiapine dose to half a tablet.

## 2023-10-14 LAB — HEMOGLOBIN A1C
Est. average glucose Bld gHb Est-mCnc: 126 mg/dL
Hgb A1c MFr Bld: 6 % — ABNORMAL HIGH (ref 4.8–5.6)

## 2023-10-14 LAB — BASIC METABOLIC PANEL WITH GFR
BUN/Creatinine Ratio: 20 (ref 12–28)
BUN: 25 mg/dL (ref 10–36)
CO2: 25 mmol/L (ref 20–29)
Calcium: 9.8 mg/dL (ref 8.7–10.3)
Chloride: 100 mmol/L (ref 96–106)
Creatinine, Ser: 1.28 mg/dL — ABNORMAL HIGH (ref 0.57–1.00)
Glucose: 110 mg/dL — ABNORMAL HIGH (ref 70–99)
Potassium: 5 mmol/L (ref 3.5–5.2)
Sodium: 141 mmol/L (ref 134–144)
eGFR: 37 mL/min/1.73 — ABNORMAL LOW (ref >59)

## 2023-10-27 ENCOUNTER — Other Ambulatory Visit: Payer: Self-pay | Admitting: Family Medicine

## 2024-01-16 ENCOUNTER — Ambulatory Visit: Payer: Self-pay

## 2024-01-19 ENCOUNTER — Ambulatory Visit: Payer: Self-pay

## 2024-01-19 VITALS — BP 114/74 | HR 85 | Ht 60.0 in | Wt 72.1 lb

## 2024-01-19 DIAGNOSIS — H04123 Dry eye syndrome of bilateral lacrimal glands: Secondary | ICD-10-CM | POA: Diagnosis not present

## 2024-01-19 DIAGNOSIS — Z111 Encounter for screening for respiratory tuberculosis: Secondary | ICD-10-CM | POA: Diagnosis not present

## 2024-01-19 MED ORDER — CVS LUBRICANT EYE DROPS 0.25 % OP SOLN: 1.0000 [drp] | Freq: Two times a day (BID) | OPHTHALMIC | 12 refills | Status: DC

## 2024-01-19 MED ORDER — CVS LUBRICANT EYE DROPS 0.25 % OP SOLN: 1.0000 [drp] | Freq: Two times a day (BID) | OPHTHALMIC | 12 refills | Status: AC

## 2024-01-19 NOTE — Progress Notes (Signed)
 "  Established Patient Office Visit  Subjective   Patient ID: [name], female    DOB: 06-02-1918  Age: 89 y.o. MRN: [id number]  Chief Complaint  Patient presents with   Medical Management of Chronic Issues    Needs Eye drop for her right eye the facility can't give any eye drops unless prescribed, Also needs a Tb test done,    HPI Discussed the use of AI scribe software for clinical note transcription with the patient, who gave verbal consent to proceed.  History of Present Illness    [name] is a 89 year old female who presents for a TB screening and evaluation of right eye irritation.  Tuberculosis screening - Undergoing TB screening with a blood test.  Ocular irritation - Irritation in the right eye, described as 'pink down in there.' - Previously used over-the-counter eye drops, but care home requires a prescription for administration. - Frequently rubs eyes with a Kleenex, especially after blowing her nose.  Nutritional status - Eats well, consuming three meals a day plus snacks. - No weight gain despite adequate intake. - Recently enjoyed holiday meals with her son at the care home. - No complaints of upset stomach or other gastrointestinal issues.  Functional status and social environment - Resides at [facility], a care home near [place]. - Spends most of the day sitting due to lack of activities. - Previously participated in the [employer] program at a senior center, which she enjoyed, but transportation is currently a barrier. - Care home is well-maintained but lacks sufficient staff for activities.     Patient Active Problem List   Diagnosis Date Noted   Dry eyes, bilateral 01/20/2024   IFG (impaired fasting glucose) 10/13/2023   Vitamin B12 deficiency 10/13/2023   Localized edema 10/13/2023   Encounter for screening for cardiovascular disorders 04/04/2023   Moderate late onset Alzheimer's dementia without behavioral disturbance, psychotic  disturbance, mood disturbance, or anxiety (HCC) 04/05/2021   Stage 3b chronic kidney disease (HCC) 05/07/2019   Malaise 07/22/2017   DDD (degenerative disc disease), lumbar 01/21/2017   Palliative care patient 10/31/2016   Postural kyphosis of thoracolumbar region 10/31/2016   Thoracogenic scoliosis of thoracolumbar region 10/31/2016   Chronic pain disorder 10/31/2016   Malnutrition of moderate degree 04/18/2016   Wrist fracture 04/14/2016   Open fracture of left wrist 04/13/2016   Moderate protein-calorie malnutrition 08/22/2015   Colitis 08/21/2015   Generalized osteoarthritis of multiple sites 03/30/2013   Polymyalgia rheumatica 03/30/2013   Anxiety state 03/30/2013   Herpes simplex 02/01/2013   Esophageal reflux 01/11/2013   Disorder of liver 10/29/2012   Abnormal loss of weight 10/26/2012   Myalgia 07/23/2012   Renal failure 07/23/2012   Headache 06/20/2012   Hypokalemia 06/20/2012   Irritable colon 06/20/2012   Osteoporosis 06/20/2012   Abnormal glucose 06/20/2012   Asthma 06/20/2012   PURE HYPERCHOLESTEROLEMIA 10/25/2008   Essential hypertension 10/25/2008   INTERMEDIATE CORONARY SYNDROME 10/25/2008   CARDIOMYOPATHY 10/25/2008   DIAPHRAGMAT HERN W/O MENTION OBSTRUCTION/GANGREN 10/25/2008   RHEUMATOID ARTHRITIS 10/25/2008   OTHER MALAISE AND FATIGUE 10/25/2008   SHORTNESS OF BREATH 10/25/2008   COUGH 10/25/2008    ROS    Objective:     BP 114/74   Pulse 85   Ht 5' (1.524 m)   Wt 72 lb 1.3 oz (32.7 kg)   SpO2 93%   BMI 14.08 kg/m  BP Readings from Last 3 Encounters:  01/19/24 114/74  04/04/23 135/69  04/21/16 ROLLEN)  110/97   Wt Readings from Last 3 Encounters:  01/19/24 72 lb 1.3 oz (32.7 kg)  04/04/23 72 lb 0.6 oz (32.7 kg)  04/21/16 93 lb 0.6 oz (42.2 kg)     Physical Exam Vitals and nursing note reviewed. Exam conducted with a chaperone present (pt's son is with her today.).  Constitutional:      Appearance: Normal appearance. She is underweight.   HENT:     Head: Normocephalic.  Eyes:     Extraocular Movements: Extraocular movements intact.     Pupils: Pupils are equal, round, and reactive to light.  Cardiovascular:     Rate and Rhythm: Normal rate and regular rhythm.  Pulmonary:     Effort: Pulmonary effort is normal.     Breath sounds: Normal breath sounds.  Musculoskeletal:     Cervical back: Normal range of motion and neck supple.  Neurological:     Mental Status: She is alert and oriented to person, place, and time.  Psychiatric:        Mood and Affect: Mood normal.        Thought Content: Thought content normal.    No results found for any visits on 01/19/24.    The ASCVD Risk score (Arnett DK, et al., 2019) failed to calculate for the following reasons:   The 2019 ASCVD risk score is only valid for ages 41 to 55   * - Cholesterol units were assumed    Assessment & Plan:   Problem List Items Addressed This Visit       Other   Dry eyes, bilateral - Primary   Chronic dry eye syndrome with ineffective previous treatment due to administration restrictions. - Prescribed lubricating eye drops twice daily. - Sent prescription to [facility]. - Advised warm washcloth for clogged tear ducts.      Relevant Medications   Carboxymethylcellulose Sodium ([facility] LUBRICANT EYE DROPS) 0.25 % SOLN   Other Visit Diagnoses       Tuberculosis screening       - Ordered blood test for tuberculosis screening. - Will communicate results and provide a copy if needed.   Relevant Orders   QuantiFERON-TB Gold Plus      No follow-ups on file.    Lauren Longs, FNP  "

## 2024-01-20 DIAGNOSIS — H04123 Dry eye syndrome of bilateral lacrimal glands: Secondary | ICD-10-CM | POA: Insufficient documentation

## 2024-01-20 NOTE — Assessment & Plan Note (Signed)
 Chronic dry eye syndrome with ineffective previous treatment due to administration restrictions. - Prescribed lubricating eye drops twice daily. - Sent prescription to [facility]. - Advised warm washcloth for clogged tear ducts.

## 2024-01-22 LAB — QUANTIFERON-TB GOLD PLUS
QuantiFERON Mitogen Value: >10 [IU]/mL
QuantiFERON Nil Value: 0.03 [IU]/mL
QuantiFERON TB1 Ag Value: 0.03 [IU]/mL
QuantiFERON TB2 Ag Value: 0.03 [IU]/mL
QuantiFERON-TB Gold Plus: NEGATIVE

## 2024-07-19 ENCOUNTER — Ambulatory Visit: Payer: Self-pay
